# Patient Record
Sex: Female | Born: 1990 | Race: White | Hispanic: No | Marital: Single | Smoking: Current every day smoker
Health system: Southern US, Community
[De-identification: ages and names within clinical notes are randomized; demographics above are authoritative.]

## PROBLEM LIST (undated history)

## (undated) DIAGNOSIS — F32A Depression, unspecified: Secondary | ICD-10-CM

## (undated) DIAGNOSIS — F4310 Post-traumatic stress disorder, unspecified: Secondary | ICD-10-CM

## (undated) DIAGNOSIS — F329 Major depressive disorder, single episode, unspecified: Secondary | ICD-10-CM

## (undated) DIAGNOSIS — A630 Anogenital (venereal) warts: Secondary | ICD-10-CM

## (undated) DIAGNOSIS — F319 Bipolar disorder, unspecified: Secondary | ICD-10-CM

## (undated) HISTORY — PX: HERNIA REPAIR: SHX51

## (undated) HISTORY — DX: Anogenital (venereal) warts: A63.0

---
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
## 2010-10-31 ENCOUNTER — Emergency Department (HOSPITAL_COMMUNITY)
Admission: EM | Admit: 2010-10-31 | Discharge: 2010-10-31 | Payer: Self-pay | Source: Home / Self Care | Admitting: Emergency Medicine

## 2010-11-02 LAB — POCT PREGNANCY, URINE: Preg Test, Ur: NEGATIVE

## 2010-11-03 ENCOUNTER — Emergency Department (HOSPITAL_COMMUNITY)
Admission: EM | Admit: 2010-11-03 | Discharge: 2010-11-03 | Payer: Self-pay | Source: Home / Self Care | Admitting: Emergency Medicine

## 2010-11-04 LAB — CBC
HCT: 38.7 % (ref 36.0–46.0)
Hemoglobin: 14 g/dL (ref 12.0–15.0)
MCH: 31.7 pg (ref 26.0–34.0)
MCHC: 36.2 g/dL — ABNORMAL HIGH (ref 30.0–36.0)
MCV: 87.8 fL (ref 78.0–100.0)
Platelets: 250 10*3/uL (ref 150–400)
RBC: 4.41 MIL/uL (ref 3.87–5.11)
RDW: 12.2 % (ref 11.5–15.5)
WBC: 9.8 10*3/uL (ref 4.0–10.5)

## 2010-11-04 LAB — BASIC METABOLIC PANEL
BUN: 6 mg/dL (ref 6–23)
CO2: 28 mEq/L (ref 19–32)
Calcium: 9.7 mg/dL (ref 8.4–10.5)
Chloride: 105 mEq/L (ref 96–112)
Creatinine, Ser: 0.66 mg/dL (ref 0.4–1.2)
GFR calc Af Amer: >60 mL/min (ref >60)
GFR calc non Af Amer: >60 mL/min (ref >60)
Glucose, Bld: 92 mg/dL (ref 70–99)
Potassium: 4.5 mEq/L (ref 3.5–5.1)
Sodium: 140 mEq/L (ref 135–145)

## 2010-11-04 LAB — DIFFERENTIAL
Basophils Absolute: 0 10*3/uL (ref 0.0–0.1)
Basophils Relative: 0 % (ref 0–1)
Eosinophils Absolute: 0.1 10*3/uL (ref 0.0–0.7)
Eosinophils Relative: 1 % (ref 0–5)
Lymphocytes Relative: 34 % (ref 12–46)
Lymphs Abs: 3.4 10*3/uL (ref 0.7–4.0)
Monocytes Absolute: 0.8 10*3/uL (ref 0.1–1.0)
Monocytes Relative: 8 % (ref 3–12)
Neutro Abs: 5.5 10*3/uL (ref 1.7–7.7)
Neutrophils Relative %: 56 % (ref 43–77)

## 2010-11-04 LAB — ETHANOL: Alcohol, Ethyl (B): <5 mg/dL (ref 0–10)

## 2010-11-04 LAB — URINALYSIS, ROUTINE W REFLEX MICROSCOPIC
Bilirubin Urine: NEGATIVE
Ketones, ur: NEGATIVE mg/dL
Leukocytes, UA: NEGATIVE
Nitrite: NEGATIVE
Protein, ur: NEGATIVE mg/dL
Specific Gravity, Urine: 1.015 (ref 1.005–1.030)
Urine Glucose, Fasting: NEGATIVE mg/dL
Urobilinogen, UA: 0.2 mg/dL (ref 0.0–1.0)
pH: 6 (ref 5.0–8.0)

## 2010-11-04 LAB — RAPID URINE DRUG SCREEN, HOSP PERFORMED
Amphetamines: NOT DETECTED
Barbiturates: NOT DETECTED
Benzodiazepines: NOT DETECTED
Cocaine: NOT DETECTED
Opiates: NOT DETECTED
Tetrahydrocannabinol: POSITIVE — AB

## 2010-11-04 LAB — URINE MICROSCOPIC-ADD ON

## 2010-11-04 LAB — PREGNANCY, URINE: Preg Test, Ur: NEGATIVE

## 2010-11-25 ENCOUNTER — Emergency Department (HOSPITAL_COMMUNITY)
Admission: EM | Admit: 2010-11-25 | Discharge: 2010-11-27 | Disposition: A | Discharge status: Other Acute Inpatient Hospital | Payer: 59 | Attending: Emergency Medicine | Admitting: Emergency Medicine

## 2010-11-25 DIAGNOSIS — F29 Unspecified psychosis not due to a substance or known physiological condition: Secondary | ICD-10-CM | POA: Insufficient documentation

## 2010-11-25 DIAGNOSIS — IMO0002 Reserved for concepts with insufficient information to code with codable children: Secondary | ICD-10-CM | POA: Insufficient documentation

## 2010-11-25 DIAGNOSIS — F411 Generalized anxiety disorder: Secondary | ICD-10-CM | POA: Insufficient documentation

## 2010-11-25 DIAGNOSIS — I498 Other specified cardiac arrhythmias: Secondary | ICD-10-CM | POA: Insufficient documentation

## 2010-11-25 LAB — URINALYSIS, ROUTINE W REFLEX MICROSCOPIC
Bilirubin Urine: NEGATIVE
Hgb urine dipstick: NEGATIVE
Ketones, ur: NEGATIVE mg/dL
Nitrite: NEGATIVE
Protein, ur: NEGATIVE mg/dL
Specific Gravity, Urine: 1.011 (ref 1.005–1.030)
Urine Glucose, Fasting: NEGATIVE mg/dL
Urobilinogen, UA: 0.2 mg/dL (ref 0.0–1.0)
pH: 7 (ref 5.0–8.0)

## 2010-11-25 LAB — ACETAMINOPHEN LEVEL: Acetaminophen (Tylenol), Serum: <10 ug/mL — ABNORMAL LOW (ref 10–30)

## 2010-11-25 LAB — RAPID URINE DRUG SCREEN, HOSP PERFORMED
Amphetamines: NOT DETECTED
Barbiturates: NOT DETECTED
Benzodiazepines: NOT DETECTED
Cocaine: NOT DETECTED
Opiates: NOT DETECTED
Tetrahydrocannabinol: NOT DETECTED

## 2010-11-25 LAB — POCT I-STAT, CHEM 8
BUN: 9 mg/dL (ref 6–23)
Calcium, Ion: 1.12 mmol/L (ref 1.12–1.32)
Chloride: 105 mEq/L (ref 96–112)
Creatinine, Ser: 0.8 mg/dL (ref 0.4–1.2)
Glucose, Bld: 104 mg/dL — ABNORMAL HIGH (ref 70–99)
HCT: 44 % (ref 36.0–46.0)
Hemoglobin: 15 g/dL (ref 12.0–15.0)
Potassium: 3.5 mEq/L (ref 3.5–5.1)
Sodium: 140 mEq/L (ref 135–145)
TCO2: 22 mmol/L (ref 0–100)

## 2010-11-25 LAB — AMMONIA: Ammonia: 15 umol/L (ref 11–35)

## 2010-11-25 LAB — TRICYCLICS SCREEN, URINE: TCA Scrn: NOT DETECTED

## 2010-11-25 LAB — CK TOTAL AND CKMB (NOT AT ARMC)
CK, MB: 1 ng/mL (ref 0.3–4.0)
Relative Index: 0 (ref 0.0–2.5)
Total CK: 79 U/L (ref 7–177)

## 2010-11-25 LAB — ETHANOL: Alcohol, Ethyl (B): <5 mg/dL (ref 0–10)

## 2010-11-25 LAB — SALICYLATE LEVEL: Salicylate Lvl: <4 mg/dL (ref 2.8–20.0)

## 2010-11-26 DIAGNOSIS — F29 Unspecified psychosis not due to a substance or known physiological condition: Secondary | ICD-10-CM

## 2010-11-27 ENCOUNTER — Inpatient Hospital Stay (HOSPITAL_COMMUNITY)
Admission: AD | Admit: 2010-11-27 | Discharge: 2010-12-01 | DRG: 885 | Disposition: A | Discharge status: Home / Self Care | Payer: 59 | Attending: Psychiatry | Admitting: Psychiatry

## 2010-11-27 DIAGNOSIS — F3289 Other specified depressive episodes: Secondary | ICD-10-CM

## 2010-11-27 DIAGNOSIS — B977 Papillomavirus as the cause of diseases classified elsewhere: Secondary | ICD-10-CM

## 2010-11-27 DIAGNOSIS — F29 Unspecified psychosis not due to a substance or known physiological condition: Principal | ICD-10-CM

## 2010-11-27 DIAGNOSIS — H5316 Psychophysical visual disturbances: Secondary | ICD-10-CM

## 2010-11-27 DIAGNOSIS — R45851 Suicidal ideations: Secondary | ICD-10-CM

## 2010-11-27 DIAGNOSIS — F329 Major depressive disorder, single episode, unspecified: Secondary | ICD-10-CM

## 2010-11-28 DIAGNOSIS — F29 Unspecified psychosis not due to a substance or known physiological condition: Secondary | ICD-10-CM
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
## 2011-01-28 ENCOUNTER — Ambulatory Visit (INDEPENDENT_AMBULATORY_CARE_PROVIDER_SITE_OTHER): Payer: 59 | Admitting: Psychiatry

## 2011-01-28 DIAGNOSIS — F29 Unspecified psychosis not due to a substance or known physiological condition: Secondary | ICD-10-CM
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
## 2011-02-11 ENCOUNTER — Encounter (INDEPENDENT_AMBULATORY_CARE_PROVIDER_SITE_OTHER): Payer: 59 | Admitting: Psychiatry

## 2011-02-11 DIAGNOSIS — F3189 Other bipolar disorder: Secondary | ICD-10-CM

## 2011-02-18 ENCOUNTER — Ambulatory Visit (HOSPITAL_COMMUNITY): Payer: 59 | Admitting: Psychiatry

## 2011-02-25 ENCOUNTER — Encounter (INDEPENDENT_AMBULATORY_CARE_PROVIDER_SITE_OTHER): Payer: 59 | Admitting: Psychiatry

## 2011-02-25 DIAGNOSIS — F3189 Other bipolar disorder: Secondary | ICD-10-CM

## 2011-03-04 ENCOUNTER — Encounter (INDEPENDENT_AMBULATORY_CARE_PROVIDER_SITE_OTHER): Payer: 59 | Admitting: Psychiatry

## 2011-03-04 DIAGNOSIS — F3189 Other bipolar disorder: Secondary | ICD-10-CM

## 2011-03-05 NOTE — H&P (Signed)
[facility]  Patient:    Carol Miles, Carol Miles Visit Number: [id number] MRN: [id number]          Service Type: DSU Location: DAY Attending Physician:  Dalia Heading Dictated by:   Franky Macho, M.D. Admit Date:  12/22/2001   CC:         Era Bumpers, M.D., [place], [place]   History and Physical  DATE OF BIRTH:  03/17/1991  CHIEF COMPLAINT:  Right inguinal hernia.  HISTORY OF PRESENT ILLNESS:  Patient is an 20 year old white female who is referred for evaluation and treatment of a right inguinal hernia.  It was first noted last week.  It causes pain with straining.  No nausea or vomiting have been noted.  PAST MEDICAL HISTORY:  Unremarkable.  PAST SURGICAL HISTORY:  Unremarkable.  CURRENT MEDICATIONS:  None.  ALLERGIES:  PENICILLIN, SULFA, ERYTHROMYCIN.  REVIEW OF SYSTEMS:  Unremarkable.  PHYSICAL EXAMINATION:  GENERAL:  Patient is a well-developed, well-nourished white female in no acute distress.  VITAL SIGNS:  She is afebrile and vital signs are stable.  LUNGS:  Clear to auscultation with equal breath sounds bilaterally.  HEART:  Regular rate and rhythm without S3, S4, or murmurs.  ABDOMEN:  Soft, nontender, and nondistended.  A small reducible right inguinal hernia is noted.  No left inguinal hernia is noted.  No hepatosplenomegaly or masses are noted.  IMPRESSION:  Right inguinal hernia.  PLAN:  The patient is scheduled for right inguinal herniorrhaphy on December 22, 2001.  The risks and benefits of the procedure including bleeding, infection, and the possibility of recurrence were fully explained to the patients mother, who gave informed consent. Dictated by:   Franky Macho, M.D. Attending Physician:  Dalia Heading DD:  12/21/01 TD:  12/22/01 Job: [id number]

## 2011-03-05 NOTE — Op Note (Signed)
[facility]  Patient:    Carol Miles, Carol Miles Visit Number: [id number] MRN: [id number]          Service Type: DSU Location: DAY Attending Physician:  Dalia Heading Dictated by:   Franky Macho, M.D. Proc. Date: 12/22/01 Admit Date:  12/22/2001   CC:         Dr. Dimas Aguas, [facility]   Operative Report  PATIENT AGE:  20 years old  PREOPERATIVE DIAGNOSIS:  Right inguinal hernia.  POSTOPERATIVE DIAGNOSIS:  Right inguinal hernia.  OPERATION:  Right inguinal herniorrhaphy.  SURGEON:  Franky Macho, M.D.  ANESTHESIA:  General.  INDICATIONS:  The patient is an 20 year old white female who presents with a right inguinal hernia.  The risks and benefits of the procedure including bleeding, infection, and recurrence of the hernia were fully explained to the patients parents who gave informed consent.  DESCRIPTION OF PROCEDURE:  The patient was placed in the supine position. After general anesthesia was administered, the right groin region was prepped and draped using the usual sterile technique with Betadine.  A transverse incision was made in the right groin region down to the external oblique aponeurosis.  The aponeurosis was incised to the external ring.  An indirect hernia sac was identified.  This was freed away from the processes vaginalis.  A high ligation of the hernia sac was performed, and the remnant hernia sac was inverted after being suture ligated with a 4-0 Tycron tie.  The processes vaginalis was also excised without difficulty.  The shelving edge of Pouparts ligament was then brought up to the conjoin tendon using 4-0 Tycron interrupted sutures.  Care was taken to avoid the ilioinguinal nerve.  The external oblique aponeurosis was reapproximated using a 3-0 Vicryl running suture.  The subcutaneous tissue was brought back together using 4-0 Vicryl interrupted sutures.  The skin was closed using a 4-0 Vicryl subcuticular suture.  Marcaine 0.25%  was instilled into the surrounding wound, and the wound was covered with collodion.  All tape and needle counts were correct at the end of the procedure.  The patient was awakened and transferred to PACU in stable condition.  COMPLICATIONS:  None.  SPECIMEN:   Right inguinal hernia sac.  ESTIMATED BLOOD LOSS:  Minimal. Dictated by:   Franky Macho, M.D. Attending Physician:  Dalia Heading DD:  12/22/01 TD:  12/23/01 Job: [id number]

## 2011-03-18 ENCOUNTER — Encounter (INDEPENDENT_AMBULATORY_CARE_PROVIDER_SITE_OTHER): Payer: 59 | Admitting: Psychiatry

## 2011-03-18 DIAGNOSIS — F3189 Other bipolar disorder: Secondary | ICD-10-CM

## 2011-04-15 ENCOUNTER — Encounter (HOSPITAL_COMMUNITY): Payer: 59 | Admitting: Psychiatry

## 2011-04-15 ENCOUNTER — Encounter (INDEPENDENT_AMBULATORY_CARE_PROVIDER_SITE_OTHER): Payer: 59 | Admitting: Psychiatry

## 2011-04-15 DIAGNOSIS — F3189 Other bipolar disorder: Secondary | ICD-10-CM

## 2011-05-13 ENCOUNTER — Encounter (INDEPENDENT_AMBULATORY_CARE_PROVIDER_SITE_OTHER): Payer: 59 | Admitting: Psychiatry

## 2011-05-13 DIAGNOSIS — F3189 Other bipolar disorder: Secondary | ICD-10-CM

## 2011-07-20 ENCOUNTER — Encounter (HOSPITAL_COMMUNITY): Payer: 59 | Admitting: Psychiatry

## 2011-07-27 ENCOUNTER — Encounter (HOSPITAL_COMMUNITY): Payer: 59 | Admitting: Psychiatry

## 2011-09-21 ENCOUNTER — Encounter (HOSPITAL_COMMUNITY): Payer: Self-pay | Admitting: Psychiatry

## 2011-09-21 ENCOUNTER — Ambulatory Visit (INDEPENDENT_AMBULATORY_CARE_PROVIDER_SITE_OTHER): Payer: 59 | Admitting: Psychiatry

## 2011-09-21 VITALS — BP 100/70

## 2011-09-21 DIAGNOSIS — F3189 Other bipolar disorder: Secondary | ICD-10-CM

## 2011-09-21 MED ORDER — ARIPIPRAZOLE 5 MG PO TABS: 5.0000 mg | ORAL_TABLET | Freq: Every day | ORAL | Status: DC

## 2011-09-21 MED ORDER — FLUOXETINE HCL 20 MG PO CAPS: 20.0000 mg | ORAL_CAPSULE | Freq: Every day | ORAL | Status: DC
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
## 2011-09-28 ENCOUNTER — Ambulatory Visit (INDEPENDENT_AMBULATORY_CARE_PROVIDER_SITE_OTHER): Payer: 59 | Admitting: Psychiatry

## 2011-09-28 DIAGNOSIS — F3189 Other bipolar disorder: Secondary | ICD-10-CM

## 2011-09-28 MED ORDER — ARIPIPRAZOLE 10 MG PO TABS: 5.0000 mg | ORAL_TABLET | Freq: Every day | ORAL | Status: DC

## 2011-09-28 NOTE — Progress Notes (Signed)
  [facility] [place] [facility] [address] Progress Note  Carol Miles [id number] 20 y.o.  09/28/2011 12:07 PM  Patient came for her followup appointment. She has started her Abilify 5 mg along with the Prozac. She is reporting feeling better with the current medication however she continued to have episodic agitation anger and racing thoughts. She still feel tired decreased motivation and has not started to look for work. Patient reported no side effects of medication. She has been on these medication in the past however decided to stop few months ago. She realized she need to go back on these medication and even willing to increase her Abilify to 10 mg. She denies any side effects of medication. She continues to have issues with her brother but she has a very good relationship with her father. She continued to smoke marijuana daily and had struggle to stopping it. However patient is determined to stop one day and has been seeing therapist on weekly basis. She denies any agitation anger or violent episode.  Current medication    Outpatient Encounter Prescriptions as of 09/28/2011  Medication Sig Dispense Refill  . ARIPiprazole (ABILIFY) 5 MG tablet Take 1 tablet (5 mg total) by mouth daily.  30 tablet  0  . FLUoxetine (PROZAC) 20 MG capsule Take 1 capsule (20 mg total) by mouth daily.  30 capsule  1  . JUNEL 1/20 1-20 MG-MCG tablet       . nystatin-triamcinolone ointment (MYCOLOG)         Mental status examination Patient is casually dressed and fairly groomed he at she appears tired but her speech is soft clear and coherent. She denies any active or passive suicidal thoughts or homicidal thoughts. Her thought process is slow but logical linear and goal-directed. Her attention and concentration is fair. She still have residual paranoia but there were no delusions or obsessions present. She denies any auditory or visual hallucination. She's alert and oriented x3. Her insight judgment and impulse  control is fair  Assessment: Axis I: Bipolar disorder with psychotic features             cannabis abuse  Axis II: Deferred  Axis III: None  Axis IV: Moderate  Axis V: 45-55   Plan:  Overall patient has been improved but she still have residual depression of anxiety depression and paranoid thinking. I will increase her Abilify to 10 mg but she was taking in the past with good response. She will continue Prozac 20 mg daily. She does not reported any side effects. She recalled having no side effects with these medication. She is very motivated for therapy and continue treatment. Again We have a long talk about stopping the marijuana. I have explained that continue use of these substances may delay her recovery and prolong her symptoms. We also talked about interaction of medication with illegal substances in detail. I reinforce about safety plan that if she continued to feel very depressed and anytime having suicidal thoughts and homicidal thoughts then she need to call us immediately or go to local ER which he agreed. I have explained in detail about the risks and benefits of medication. I will see her again in 4 weeks week. She will continue her therapy on weekly basis with Edwyna Shell.  A new prescription for Abilify 10 mg is given for 90 day supply.  Sowmya Partridge T., MD 09/28/2011

## 2011-11-11 ENCOUNTER — Ambulatory Visit (INDEPENDENT_AMBULATORY_CARE_PROVIDER_SITE_OTHER): Payer: 59 | Admitting: Psychiatry

## 2011-11-11 ENCOUNTER — Encounter (HOSPITAL_COMMUNITY): Payer: Self-pay | Admitting: Psychiatry

## 2011-11-11 VITALS — Wt 160.0 lb

## 2011-11-11 DIAGNOSIS — F3189 Other bipolar disorder: Secondary | ICD-10-CM

## 2011-11-11 MED ORDER — ARIPIPRAZOLE 10 MG PO TABS: 10.0000 mg | ORAL_TABLET | Freq: Every day | ORAL | Status: DC

## 2011-11-11 MED ORDER — FLUOXETINE HCL 20 MG PO CAPS: 20.0000 mg | ORAL_CAPSULE | Freq: Every day | ORAL | Status: DC

## 2011-11-11 NOTE — Progress Notes (Signed)
[facility] [address] Progress Note  Carol Miles [id number] 21 y.o.  11/11/2011 2:46 PM  History of presenting illness  Patient came for her followup appointment. She is now taking Abilify 10 mg. She reported no side effects of medication however she is concerned about weight gain. She gain 10 lbs since started Abilify.  She is afraid to stop Abilify as Abilify did help her paranoid thinking and her mood. She is less depressed and less irritable. She sleeps good. She denies any agitation anger or mood swings. She continues to have issues with her boyfriend but she is tolerating very well. She still complain of breast lactation at times however she has not seen her primary care physician or any prolactin level has been done.  Psychosocial history Patient lives with her father. She has 4 other siblings. She is not close with her mother who lives in [place]. She had a good relationship with her father. She had a very quiet Christmas. Her brother came to visit her. Patient is still looking for a job and she has interview at [employer] and she is hopeful to get this job.  Alcohol and substance use history She continues to smoke marijuana on occasion mostly with her friends. However her intensity and frequency has been reduced from the past. She is seeing her therapist every 2 weeks.   Medical history Unchanged  Current medication   Outpatient Encounter Prescriptions as of 11/11/2011  Medication Sig Dispense Refill  . ARIPiprazole (ABILIFY) 10 MG tablet Take 1 tablet (10 mg total) by mouth daily.  90 tablet  0  . CHERATUSSIN AC 100-10 MG/5ML syrup       . FLUoxetine (PROZAC) 20 MG capsule Take 1 capsule (20 mg total) by mouth daily.  90 capsule  0  . DISCONTD: ARIPiprazole (ABILIFY) 10 MG tablet Take 0.5 tablets (5 mg total) by mouth daily.  90 tablet  0  . DISCONTD: FLUoxetine (PROZAC) 20 MG capsule Take 1 capsule (20 mg total) by mouth daily.  30 capsule  1  . JUNEL 1/20 1-20  MG-MCG tablet       . nystatin-triamcinolone ointment (MYCOLOG)        Past psychiatric history Patient has at least one previous psychiatric hospitalization in February 2012. She has suicidal attempt when she [place]. In the past she had tried Risperdal which was a stop due to breast lactation.   Mental status examination Patient is casually dressed and fairly groomed. She is more cooperative and pleasant from the past. Her speech is clear and coherent. She denies any active or passive suicidal thoughts or homicidal thoughts. Her thought process is slow but logical linear and goal-directed. Her attention and concentration is improved from the past She still have some some residual paranoia but there were no delusions or obsessions present. She denies any auditory or visual hallucination. She's alert and oriented x3. Her insight judgment and impulse control is fair  Assessment: Axis I: Bipolar disorder with psychotic features             cannabis abuse  Axis II: Deferred  Axis III: None  Axis IV: Moderate  Axis V: 45-55   Plan:  Overall patient has been improved since we increased Abilify to 10 mg. Patient reported no side effects including tremors or extrapyramidal side effects. I talk about exercise and healthy diet to control her weight. She admitted to she's been not watching her diet carefully. She does not want to stop  Abilify which did help her a lot.  She will continue Prozac 20 mg daily. She does not reported any side effects. She is very motivated for therapy and continue treatment. Again We have a long talk about stopping the marijuana. I have explained that continue use of these substances may delay her recovery and prolong her symptoms. We also talked about interaction of medication with illegal substances in detail. I reinforce about safety plan that if she continued to feel very depressed and anytime having suicidal thoughts and homicidal thoughts then she need  to call us immediately or go to local ER which he agreed. I have explained in detail about the risks and benefits of medication. I will see her again in 8 weeks week. She will continue her therapy on weekly basis with Carol Miles.  A new prescription for Abilify 10 mg and Prozac 20 mg is given for 90 day supply. I also recommended to have blood work including prolactin level at primary care physician. Time spent 30 minutes. I will see her again in 2 months Carol Popson T., MD 11/11/2011

## 2011-11-14 ENCOUNTER — Other Ambulatory Visit (HOSPITAL_COMMUNITY): Payer: Self-pay | Admitting: Psychiatry

## 2011-11-15 ENCOUNTER — Other Ambulatory Visit (HOSPITAL_COMMUNITY): Payer: Self-pay | Admitting: Psychiatry

## 2011-11-15 NOTE — Telephone Encounter (Signed)
New script given last week with change dose

## 2012-01-06 ENCOUNTER — Ambulatory Visit (HOSPITAL_COMMUNITY): Payer: 59 | Admitting: Psychiatry

## 2012-01-17 ENCOUNTER — Telehealth (HOSPITAL_COMMUNITY): Payer: Self-pay | Admitting: *Deleted

## 2012-01-18 ENCOUNTER — Ambulatory Visit (INDEPENDENT_AMBULATORY_CARE_PROVIDER_SITE_OTHER): Payer: 59 | Admitting: Psychiatry

## 2012-01-18 ENCOUNTER — Encounter (HOSPITAL_COMMUNITY): Payer: Self-pay | Admitting: Psychiatry

## 2012-01-18 DIAGNOSIS — F3189 Other bipolar disorder: Secondary | ICD-10-CM

## 2012-01-18 MED ORDER — FLUOXETINE HCL 20 MG PO CAPS: 20.0000 mg | ORAL_CAPSULE | Freq: Every day | ORAL | Status: DC

## 2012-01-18 NOTE — Progress Notes (Signed)
[facility] [address] Progress Note  Carol Miles [id number] 21 y.o.  01/18/2012 11:30 AM  Chief complaint Medication management and followup.  History of presenting illness  Patient is 21 years old female who came for her followup appointment. She is compliant with Abilify 10 mg and Prozac 20 mg daily.  She's doing much better but the medication however she admits some time she forgets to take her medication on time.  She has recently stopped working at [employer] and liked her work.  She denies using drugs or alcohol.  She recently broke up with her boyfriend due to his continued use of drugs.  Patient is very happy that she got job at [employer] and she is drug free.  She passed her drug test and feel proud about it.  She is sleeping fine however she continues to have dreams on occasion.  She denies any agitation anger or mood swings.  She denies any crying spells or any social isolation.  She likes Abilify which really makes her capable to keep a job.  She is less paranoid and less agitated.  She denies any tremors or any side effects however she has gained weight from the past.  She is seriously thinking about exercise and hoping to start walking regularly .  She's also watching her diet carefully .  She denies any concerns for the medication to like to continue her Abilify and Prozac.    Current psychiatric medication Prozac 40 mg daily Abilify 10 mg daily   Psychosocial history Patient lives with her father. She has 4 other siblings. She is not close with her mother who lives in [place]. She had a good relationship with her father.   Alcohol and substance use history She denies any current use of alcohol or substance use.  Medical history Unchanged  Current medication  Outpatient Encounter Prescriptions as of 01/18/2012  Medication Sig Dispense Refill  . ARIPiprazole (ABILIFY) 10 MG tablet Take 1 tablet (10 mg total) by mouth daily.  90 tablet  0  . FLUoxetine (PROZAC)  20 MG capsule Take 1 capsule (20 mg total) by mouth daily.  90 capsule  0  . CHERATUSSIN AC 100-10 MG/5ML syrup       . imiquimod (ALDARA) 5 % cream       . JUNEL 1/20 1-20 MG-MCG tablet       . nystatin-triamcinolone ointment (MYCOLOG)        Past psychiatric history Patient has at least one previous psychiatric hospitalization in February 2012. She has suicidal attempt when she [place] her [place]. In the past she had tried Risperdal which was a stop due to breast lactation.   Mental status examination Patient is casually dressed and fairly groomed. She is cooperative and pleasant. Her speech is clear and coherent. She denies any active or passive suicidal thoughts or homicidal thoughts. Her thought process is slow but logical linear and goal-directed. Her attention and concentration is improved from the past She denies any paranoia or delusions .  She described her mood is good and her affect is improved from the past.  She denies any auditory or visual hallucination. She's alert and oriented x3. Her insight judgment and impulse control is fair  Assessment: Axis I: Bipolar disorder with psychotic features             cannabis abuse Axis II: Deferred Axis III: None Axis IV: Moderate Axis V: 45-55   Plan:  I will continue her current medication which  is Abilify 10 mg daily and Prozac 20 mg daily.  I have explained risks and benefits of medication in detail.  She will continue to see therapist for increase coping and social skills.  I encourage her to watch her diet and do regular exercise to prevent any weight gain .  We will consider blood work on her next visit.  I recommended to call us if she feels worsening of symptoms or any time having suicidal thinking and homicidal thinking.  I will see her again in 8 weeks.   Letcher Schweikert T., MD 01/18/2012

## 2012-01-25 ENCOUNTER — Encounter (HOSPITAL_COMMUNITY): Payer: Self-pay | Admitting: Psychiatry

## 2012-01-25 ENCOUNTER — Ambulatory Visit (INDEPENDENT_AMBULATORY_CARE_PROVIDER_SITE_OTHER): Payer: 59 | Admitting: Psychiatry

## 2012-01-25 VITALS — BP 112/64 | HR 85 | Wt 167.0 lb

## 2012-01-25 DIAGNOSIS — F3189 Other bipolar disorder: Secondary | ICD-10-CM

## 2012-01-25 MED ORDER — ARIPIPRAZOLE 10 MG PO TABS: 2.0000 mg | ORAL_TABLET | Freq: Every day | ORAL | Status: DC

## 2012-01-25 MED ORDER — ARIPIPRAZOLE 10 MG PO TABS: 10.0000 mg | ORAL_TABLET | Freq: Every day | ORAL | Status: DC

## 2012-01-25 MED ORDER — FLUOXETINE HCL 10 MG PO CAPS: ORAL_CAPSULE | ORAL | Status: DC
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
## 2012-02-03 ENCOUNTER — Ambulatory Visit (INDEPENDENT_AMBULATORY_CARE_PROVIDER_SITE_OTHER): Payer: 59 | Admitting: Psychiatry

## 2012-02-03 ENCOUNTER — Encounter (HOSPITAL_COMMUNITY): Payer: Self-pay | Admitting: Psychiatry

## 2012-02-03 DIAGNOSIS — F319 Bipolar disorder, unspecified: Secondary | ICD-10-CM
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
## 2012-02-24 ENCOUNTER — Ambulatory Visit (HOSPITAL_COMMUNITY): Payer: Self-pay | Admitting: Psychiatry

## 2012-02-28 ENCOUNTER — Encounter (HOSPITAL_COMMUNITY): Payer: Self-pay | Admitting: *Deleted

## 2012-03-21 ENCOUNTER — Ambulatory Visit (HOSPITAL_COMMUNITY): Payer: Self-pay | Admitting: Psychiatry

## 2012-03-29 ENCOUNTER — Other Ambulatory Visit (HOSPITAL_COMMUNITY): Payer: Self-pay | Admitting: *Deleted

## 2012-03-29 ENCOUNTER — Other Ambulatory Visit (HOSPITAL_COMMUNITY): Payer: Self-pay | Admitting: Psychiatry

## 2012-03-29 DIAGNOSIS — F3189 Other bipolar disorder: Secondary | ICD-10-CM

## 2012-03-29 MED ORDER — ARIPIPRAZOLE 5 MG PO TABS: ORAL_TABLET | ORAL | Status: DC

## 2012-03-29 MED ORDER — FLUOXETINE HCL 10 MG PO CAPS: ORAL_CAPSULE | ORAL | Status: DC

## 2012-03-30 ENCOUNTER — Other Ambulatory Visit (HOSPITAL_COMMUNITY): Payer: Self-pay

## 2012-04-04 ENCOUNTER — Encounter (HOSPITAL_COMMUNITY): Payer: Self-pay | Admitting: *Deleted

## 2012-04-04 ENCOUNTER — Other Ambulatory Visit (HOSPITAL_COMMUNITY): Payer: Self-pay | Admitting: *Deleted

## 2012-04-04 NOTE — Progress Notes (Signed)
Authorization for Abilify (quantity override) obtained from [employer] from 03/05/12 to 05/04/12. Auth # [id number]

## 2013-12-31 ENCOUNTER — Encounter (HOSPITAL_COMMUNITY): Payer: Self-pay | Admitting: Emergency Medicine

## 2013-12-31 ENCOUNTER — Emergency Department (HOSPITAL_COMMUNITY)
Admission: EM | Admit: 2013-12-31 | Discharge: 2013-12-31 | Disposition: A | Discharge status: Home / Self Care | Payer: 59 | Attending: Emergency Medicine | Admitting: Emergency Medicine

## 2013-12-31 ENCOUNTER — Emergency Department (HOSPITAL_COMMUNITY): Payer: 59

## 2013-12-31 DIAGNOSIS — Z880 Allergy status to penicillin: Secondary | ICD-10-CM | POA: Insufficient documentation

## 2013-12-31 DIAGNOSIS — Z79899 Other long term (current) drug therapy: Secondary | ICD-10-CM | POA: Insufficient documentation

## 2013-12-31 DIAGNOSIS — Z8619 Personal history of other infectious and parasitic diseases: Secondary | ICD-10-CM | POA: Insufficient documentation

## 2013-12-31 DIAGNOSIS — F17200 Nicotine dependence, unspecified, uncomplicated: Secondary | ICD-10-CM | POA: Insufficient documentation

## 2013-12-31 DIAGNOSIS — T07XXXA Unspecified multiple injuries, initial encounter: Secondary | ICD-10-CM

## 2013-12-31 DIAGNOSIS — S46909A Unspecified injury of unspecified muscle, fascia and tendon at shoulder and upper arm level, unspecified arm, initial encounter: Secondary | ICD-10-CM | POA: Insufficient documentation

## 2013-12-31 DIAGNOSIS — Y92410 Unspecified street and highway as the place of occurrence of the external cause: Secondary | ICD-10-CM | POA: Insufficient documentation

## 2013-12-31 DIAGNOSIS — Y9389 Activity, other specified: Secondary | ICD-10-CM | POA: Insufficient documentation

## 2013-12-31 DIAGNOSIS — S298XXA Other specified injuries of thorax, initial encounter: Secondary | ICD-10-CM | POA: Insufficient documentation

## 2013-12-31 DIAGNOSIS — S4980XA Other specified injuries of shoulder and upper arm, unspecified arm, initial encounter: Secondary | ICD-10-CM | POA: Insufficient documentation

## 2013-12-31 MED ORDER — IBUPROFEN 800 MG PO TABS
800.0000 mg | ORAL_TABLET | Freq: Once | ORAL | Status: AC
  Administered 2013-12-31: 800 mg via ORAL
  Filled 2013-12-31: qty 1

## 2013-12-31 NOTE — ED Notes (Signed)
Pt resting quietly, respirations regular, even and unlabored.  No distress noted.
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
## 2013-12-31 NOTE — ED Notes (Signed)
Pt given breakfast tray to eat in room and instructed to wait in lobby. Pt informed she will be able to use phone to find d/c transportation.

## 2013-12-31 NOTE — ED Notes (Signed)
Patient left ED at this time. No distress.

## 2013-12-31 NOTE — ED Notes (Signed)
Patient requesting pain medication prior to discharge. Dr Read DriversMolpus gave verbal order for ibuprofen 800 mg PO if patient requested before discharge.

## 2014-07-28 ENCOUNTER — Encounter (HOSPITAL_COMMUNITY): Payer: Self-pay | Admitting: Emergency Medicine

## 2014-07-28 ENCOUNTER — Emergency Department (INDEPENDENT_AMBULATORY_CARE_PROVIDER_SITE_OTHER)
Admission: EM | Admit: 2014-07-28 | Discharge: 2014-07-28 | Disposition: A | Discharge status: Home / Self Care | Payer: 59 | Source: Home / Self Care | Attending: Emergency Medicine | Admitting: Emergency Medicine

## 2014-07-28 DIAGNOSIS — H6502 Acute serous otitis media, left ear: Secondary | ICD-10-CM

## 2014-07-28 DIAGNOSIS — H9202 Otalgia, left ear: Secondary | ICD-10-CM

## 2014-07-28 MED ORDER — TRAMADOL HCL 50 MG PO TABS: 50.0000 mg | ORAL_TABLET | Freq: Four times a day (QID) | ORAL | Status: DC | PRN

## 2014-07-28 MED ORDER — CEFUROXIME AXETIL 250 MG PO TABS: 250.0000 mg | ORAL_TABLET | Freq: Two times a day (BID) | ORAL | Status: DC

## 2014-07-28 MED ORDER — ANTIPYRINE-BENZOCAINE 5.4-1.4 % OT SOLN: 3.0000 [drp] | OTIC | Status: DC | PRN

## 2014-07-28 NOTE — ED Notes (Signed)
Reports just getting over a cold now having left ear pain.  Denies drainage.  No relief with otc ear drops.  Symptoms since 10/9.  Denies fever, n/v/d

## 2014-07-28 NOTE — ED Provider Notes (Signed)
Medical screening examination/treatment/procedure(s) were performed by non-physician practitioner and as supervising physician I was immediately available for consultation/collaboration.  Leslee Homeavid Rider Ermis, M.D.  Reuben Likesavid C Dian Laprade, MD 07/28/14 [id number]

## 2014-07-28 NOTE — Discharge Instructions (Signed)
Ear Drops You have been diagnosed with a condition requiring you to put drops of medicine into your outer ear. HOME CARE INSTRUCTIONS   Put drops in the affected ear as instructed. After putting the drops in, you will need to lie down with the affected ear facing up for ten minutes so the drops will remain in the ear canal and run down and fill the canal. Continue using the ear drops for as long as directed by your health care provider.  Prior to getting up, put a cotton ball gently in your ear canal. Leave enough of the cotton ball out so it can be easily removed. Do not attempt to push this down into the canal with a cotton-tipped swab or other instrument.  Do not irrigate or wash out your ears if you have had a perforated eardrum or mastoid surgery, or unless instructed to do so by your health care provider.  Keep appointments with your health care provider as instructed.  Finish all medicine, or use for the length of time prescribed by your health care provider. Continue the drops even if your problem seems to be doing well after a couple days, or continue as instructed. SEEK MEDICAL CARE IF:  You become worse or develop increasing pain.  You notice any unusual drainage from your ear (particularly if the drainage has a bad smell).  You develop hearing difficulties.  You experience a serious form of dizziness in which you feel as if the room is spinning, and you feel nauseated (vertigo).  The outside of your ear becomes red or swollen or both. This may be a sign of an allergic reaction. MAKE SURE YOU:   Understand these instructions.  Will watch your condition.  Will get help right away if you are not doing well or get worse. Document Released: 09/28/2001 Document Revised: 10/09/2013 Document Reviewed: 05/01/2013 [facility] Patient Information 2015 [place], [place]. This information is not intended to replace advice given to you by your health care provider. Make sure you discuss any  questions you have with your health care provider.  Otitis Media With Effusion Otitis media with effusion is the presence of fluid in the middle ear. This is a common problem in children, which often follows ear infections. It may be present for weeks or longer after the infection. Unlike an acute ear infection, otitis media with effusion refers only to fluid behind the ear drum and not infection. Children with repeated ear and sinus infections and allergy problems are the most likely to get otitis media with effusion. CAUSES  The most frequent cause of the fluid buildup is dysfunction of the eustachian tubes. These are the tubes that drain fluid in the ears to the back of the nose (nasopharynx). SYMPTOMS   The main symptom of this condition is hearing loss. As a result, you or your child may:  Listen to the TV at a loud volume.  Not respond to questions.  Ask "what" often when spoken to.  Mistake or confuse one sound or word for another.  There may be a sensation of fullness or pressure but usually not pain. DIAGNOSIS   Your health care provider will diagnose this condition by examining you or your child's ears.  Your health care provider may test the pressure in you or your child's ear with a tympanometer.  A hearing test may be conducted if the problem persists. TREATMENT   Treatment depends on the duration and the effects of the effusion.  Antibiotics, decongestants, nose  drops, and cortisone-type drugs (tablets or nasal spray) may not be helpful.  Children with persistent ear effusions may have delayed language or behavioral problems. Children at risk for developmental delays in hearing, learning, and speech may require referral to a specialist earlier than children not at risk.  You or your child's health care provider may suggest a referral to an ear, nose, and throat surgeon for treatment. The following may help restore normal hearing:  Drainage of fluid.  Placement of  ear tubes (tympanostomy tubes).  Removal of adenoids (adenoidectomy). HOME CARE INSTRUCTIONS   Avoid secondhand smoke.  Infants who are breastfed are less likely to have this condition.  Avoid feeding infants while they are lying flat.  Avoid known environmental allergens.  Avoid people who are sick. SEEK MEDICAL CARE IF:   Hearing is not better in 3 months.  Hearing is worse.  Ear pain.  Drainage from the ear.  Dizziness. MAKE SURE YOU:   Understand these instructions.  Will watch your condition.  Will get help right away if you are not doing well or get worse. Document Released: 11/11/2004 Document Revised: 02/18/2014 Document Reviewed: 05/01/2013 [facility] Patient Information 2015 [place], [place]. This information is not intended to replace advice given to you by your health care provider. Make sure you discuss any questions you have with your health care provider.  Otalgia The most common reason for this in children is an infection of the middle ear. Pain from the middle ear is usually caused by a build-up of fluid and pressure behind the eardrum. Pain from an earache can be sharp, dull, or burning. The pain may be temporary or constant. The middle ear is connected to the nasal passages by a short narrow tube called the Eustachian tube. The Eustachian tube allows fluid to drain out of the middle ear, and helps keep the pressure in your ear equalized. CAUSES  A cold or allergy can block the Eustachian tube with inflammation and the build-up of secretions. This is especially likely in small children, because their Eustachian tube is shorter and more horizontal. When the Eustachian tube closes, the normal flow of fluid from the middle ear is stopped. Fluid can accumulate and cause stuffiness, pain, hearing loss, and an ear infection if germs start growing in this area. SYMPTOMS  The symptoms of an ear infection may include fever, ear pain, fussiness, increased crying, and  irritability. Many children will have temporary and minor hearing loss during and right after an ear infection. Permanent hearing loss is rare, but the risk increases the more infections a child has. Other causes of ear pain include retained water in the outer ear canal from swimming and bathing. Ear pain in adults is less likely to be from an ear infection. Ear pain may be referred from other locations. Referred pain may be from the joint between your jaw and the skull. It may also come from a tooth problem or problems in the neck. Other causes of ear pain include:  A foreign body in the ear.  Outer ear infection.  Sinus infections.  Impacted ear wax.  Ear injury.  Arthritis of the jaw or TMJ problems.  Middle ear infection.  Tooth infections.  Sore throat with pain to the ears. DIAGNOSIS  Your caregiver can usually make the diagnosis by examining you. Sometimes other special studies, including x-rays and lab work may be necessary. TREATMENT   If antibiotics were prescribed, use them as directed and finish them even if you or your  child's symptoms seem to be improved.  Sometimes PE tubes are needed in children. These are little plastic tubes which are put into the eardrum during a simple surgical procedure. They allow fluid to drain easier and allow the pressure in the middle ear to equalize. This helps relieve the ear pain caused by pressure changes. HOME CARE INSTRUCTIONS   Only take over-the-counter or prescription medicines for pain, discomfort, or fever as directed by your caregiver. DO NOT GIVE CHILDREN ASPIRIN because of the association of Reye's Syndrome in children taking aspirin.  Use a cold pack applied to the outer ear for 15-20 minutes, 03-04 times per day or as needed may reduce pain. Do not apply ice directly to the skin. You may cause frost bite.  Over-the-counter ear drops used as directed may be effective. Your caregiver may sometimes prescribe ear  drops.  Resting in an upright position may help reduce pressure in the middle ear and relieve pain.  Ear pain caused by rapidly descending from high altitudes can be relieved by swallowing or chewing gum. Allowing infants to suck on a bottle during airplane travel can help.  Do not smoke in the house or near children. If you are unable to quit smoking, smoke outside.  Control allergies. SEEK IMMEDIATE MEDICAL CARE IF:   You or your child are becoming sicker.  Pain or fever relief is not obtained with medicine.  You or your child's symptoms (pain, fever, or irritability) do not improve within 24 to 48 hours or as instructed.  Severe pain suddenly stops hurting. This may indicate a ruptured eardrum.  You or your children develop new problems such as severe headaches, stiff neck, difficulty swallowing, or swelling of the face or around the ear. Document Released: 05/21/2004 Document Revised: 12/27/2011 Document Reviewed: 09/25/2008 [facility] Patient Information 2015 [place], [place]. This information is not intended to replace advice given to you by your health care provider. Make sure you discuss any questions you have with your health care provider.

## 2014-07-28 NOTE — ED Provider Notes (Signed)
CSN: [id number]     Arrival date & time 07/28/14  1359 History   First MD Initiated Contact with Patient 07/28/14 1455     Chief Complaint  Patient presents with  . Otalgia   (Consider location/radiation/quality/duration/timing/severity/associated sxs/prior Treatment) HPI Comments: Last week with URi sx's that have mostly abated. Now with PND with left otalgia, not relieved with OTC meds.   Past Medical History  Diagnosis Date  . HPV (human papilloma virus) anogenital infection    History reviewed. No pertinent past surgical history. Family History  Problem Relation Age of Onset  . OCD Brother    History  Substance Use Topics  . Smoking status: Current Every Day Smoker -- 1.00 packs/day for 6 years    Types: Cigarettes  . Smokeless tobacco: Not on file  . Alcohol Use: Yes   OB History   Grav Para Term Preterm Abortions TAB SAB Ect Mult Living                 Review of Systems  Constitutional: Negative.   HENT: Positive for ear pain and postnasal drip. Negative for sore throat.   Respiratory: Negative.   Cardiovascular: Negative.   Gastrointestinal: Negative.   Skin: Negative for rash.  Neurological: Negative.     Allergies  Amoxicillin; Penicillins; and Sulfa antibiotics  Home Medications   Prior to Admission medications   Medication Sig Start Date End Date Taking? Authorizing Provider  ARIPiprazole (ABILIFY) 5 MG tablet Take 2 and 1/2 tab daily 03/29/12  Yes Cleotis NipperSyed T Arfeen, MD  citalopram (CELEXA) 20 MG tablet Take 20 mg by mouth daily.   Yes Historical Provider, MD  antipyrine-benzocaine Lyla Son(AURALGAN) otic solution Place 3-4 drops into the left ear every 2 (two) hours as needed for ear pain. 07/28/14   Hayden Rasmussenavid Arnet Hofferber, NP  cefUROXime (CEFTIN) 250 MG tablet Take 1 tablet (250 mg total) by mouth 2 (two) times daily with a meal. 07/28/14   Hayden Rasmussenavid Neesha Langton, NP  CHERATUSSIN AC 100-10 MG/5ML syrup  11/08/11   Historical Provider, MD  FLUoxetine (PROZAC) 10 MG capsule Take 3 capsule  every day 03/29/12   Cleotis NipperSyed T Arfeen, MD  imiquimod Mathis Dad(ALDARA) 5 % cream  11/17/11   Historical Provider, MD  Colleen CanJUNEL 1/20 1-20 MG-MCG tablet  06/15/11   Historical Provider, MD  nystatin-triamcinolone ointment Arvilla Market(MYCOLOG)  06/15/11   Historical Provider, MD  traMADol (ULTRAM) 50 MG tablet Take 1 tablet (50 mg total) by mouth every 6 (six) hours as needed. 07/28/14   Hayden Rasmussenavid Jerika Wales, NP   BP 119/69  Pulse 77  Temp(Src) 98.7 F (37.1 C) (Oral)  Resp 16  SpO2 98%  LMP 06/28/2014 Physical Exam  Nursing note and vitals reviewed. Constitutional: She is oriented to person, place, and time. She appears well-developed and well-nourished. No distress.  HENT:  Right Ear: External ear normal.  Mouth/Throat: Oropharynx is clear and moist. No oropharyngeal exudate.  L TM partially obscurred by wax. Portion ofTM bulging with apparent effusion. No dranage or moisture to the EAC  Eyes: Conjunctivae and EOM are normal.  Neck: Normal range of motion. Neck supple.  Pulmonary/Chest: Effort normal. No respiratory distress.  Lymphadenopathy:    She has no cervical adenopathy.  Neurological: She is alert and oriented to person, place, and time.  Skin: Skin is warm and dry.  Psychiatric: She has a normal mood and affect.    ED Course  Procedures (including critical care time) Labs Review Labs Reviewed - No data to display  Imaging Review  No results found.   MDM   1. Acute serous otitis media of left ear, recurrence not specified   2. Otalgia of left ear    Ceftin 250 mg .   Pt wt has taken cephalosporins without allergic reactions in past Auralgan otic gtts Tramadol F.U with PCP    Hayden Rasmussenavid Sheronda Parran, NP 07/28/14 1531

## 2018-09-02 DIAGNOSIS — F310 Bipolar disorder, current episode hypomanic: Secondary | ICD-10-CM | POA: Insufficient documentation

## 2018-09-02 DIAGNOSIS — F1220 Cannabis dependence, uncomplicated: Secondary | ICD-10-CM | POA: Insufficient documentation

## 2019-02-25 DIAGNOSIS — F1520 Other stimulant dependence, uncomplicated: Secondary | ICD-10-CM | POA: Insufficient documentation

## 2019-02-27 DIAGNOSIS — F17200 Nicotine dependence, unspecified, uncomplicated: Secondary | ICD-10-CM | POA: Insufficient documentation

## 2019-02-27 DIAGNOSIS — F1320 Sedative, hypnotic or anxiolytic dependence, uncomplicated: Secondary | ICD-10-CM | POA: Insufficient documentation

## 2019-02-27 DIAGNOSIS — F1910 Other psychoactive substance abuse, uncomplicated: Secondary | ICD-10-CM | POA: Insufficient documentation

## 2019-02-27 DIAGNOSIS — F3162 Bipolar disorder, current episode mixed, moderate: Secondary | ICD-10-CM | POA: Insufficient documentation

## 2019-03-24 ENCOUNTER — Encounter (HOSPITAL_COMMUNITY): Payer: Self-pay | Admitting: Emergency Medicine

## 2019-03-24 ENCOUNTER — Emergency Department (HOSPITAL_COMMUNITY): Payer: Self-pay

## 2019-03-24 ENCOUNTER — Emergency Department (HOSPITAL_COMMUNITY)
Admission: EM | Admit: 2019-03-24 | Discharge: 2019-03-25 | Disposition: A | Discharge status: Home / Self Care | Payer: Self-pay | Attending: Emergency Medicine | Admitting: Emergency Medicine

## 2019-03-24 ENCOUNTER — Other Ambulatory Visit: Payer: Self-pay

## 2019-03-24 DIAGNOSIS — N83201 Unspecified ovarian cyst, right side: Secondary | ICD-10-CM | POA: Insufficient documentation

## 2019-03-24 DIAGNOSIS — R102 Pelvic and perineal pain: Secondary | ICD-10-CM | POA: Insufficient documentation

## 2019-03-24 DIAGNOSIS — N83209 Unspecified ovarian cyst, unspecified side: Secondary | ICD-10-CM

## 2019-03-24 DIAGNOSIS — D72829 Elevated white blood cell count, unspecified: Secondary | ICD-10-CM | POA: Insufficient documentation

## 2019-03-24 HISTORY — DX: Bipolar disorder, unspecified: F31.9

## 2019-03-24 HISTORY — DX: Depression, unspecified: F32.A

## 2019-03-24 LAB — COMPREHENSIVE METABOLIC PANEL
ALT: 12 U/L (ref 0–44)
AST: 13 U/L — ABNORMAL LOW (ref 15–41)
Albumin: 4.6 g/dL (ref 3.5–5.0)
Alkaline Phosphatase: 46 U/L (ref 38–126)
Anion gap: 11 (ref 5–15)
BUN: 13 mg/dL (ref 6–20)
CO2: 23 mmol/L (ref 22–32)
Calcium: 9.1 mg/dL (ref 8.9–10.3)
Chloride: 103 mmol/L (ref 98–111)
Creatinine, Ser: 0.69 mg/dL (ref 0.44–1.00)
GFR calc Af Amer: >60 mL/min (ref >60)
GFR calc non Af Amer: >60 mL/min (ref >60)
Glucose, Bld: 112 mg/dL — ABNORMAL HIGH (ref 70–99)
Potassium: 3.8 mmol/L (ref 3.5–5.1)
Sodium: 137 mmol/L (ref 135–145)
Total Bilirubin: 0.7 mg/dL (ref 0.3–1.2)
Total Protein: 7.6 g/dL (ref 6.5–8.1)

## 2019-03-24 LAB — CBC WITH DIFFERENTIAL/PLATELET
Abs Immature Granulocytes: 0.08 10*3/uL — ABNORMAL HIGH (ref 0.00–0.07)
Basophils Absolute: 0.1 10*3/uL (ref 0.0–0.1)
Basophils Relative: 0 %
Eosinophils Absolute: 0.1 10*3/uL (ref 0.0–0.5)
Eosinophils Relative: 1 %
HCT: 40 % (ref 36.0–46.0)
Hemoglobin: 13.6 g/dL (ref 12.0–15.0)
Immature Granulocytes: 0 %
Lymphocytes Relative: 16 %
Lymphs Abs: 3 10*3/uL (ref 0.7–4.0)
MCH: 31.7 pg (ref 26.0–34.0)
MCHC: 34 g/dL (ref 30.0–36.0)
MCV: 93.2 fL (ref 80.0–100.0)
Monocytes Absolute: 1.1 10*3/uL — ABNORMAL HIGH (ref 0.1–1.0)
Monocytes Relative: 6 %
Neutro Abs: 14.1 10*3/uL — ABNORMAL HIGH (ref 1.7–7.7)
Neutrophils Relative %: 77 %
Platelets: 308 10*3/uL (ref 150–400)
RBC: 4.29 MIL/uL (ref 3.87–5.11)
RDW: 12.1 % (ref 11.5–15.5)
WBC: 18.4 10*3/uL — ABNORMAL HIGH (ref 4.0–10.5)
nRBC: 0 % (ref 0.0–0.2)

## 2019-03-24 LAB — URINALYSIS, ROUTINE W REFLEX MICROSCOPIC
Bilirubin Urine: NEGATIVE
Glucose, UA: NEGATIVE mg/dL
Hgb urine dipstick: NEGATIVE
Ketones, ur: NEGATIVE mg/dL
Leukocytes,Ua: NEGATIVE
Nitrite: NEGATIVE
Protein, ur: NEGATIVE mg/dL
Specific Gravity, Urine: 1.012 (ref 1.005–1.030)
pH: 5 (ref 5.0–8.0)

## 2019-03-24 LAB — WET PREP, GENITAL
Clue Cells Wet Prep HPF POC: NONE SEEN
Sperm: NONE SEEN
Trich, Wet Prep: NONE SEEN
Yeast Wet Prep HPF POC: NONE SEEN

## 2019-03-24 LAB — LIPASE, BLOOD: Lipase: 39 U/L (ref 11–51)

## 2019-03-24 LAB — POC URINE PREG, ED: Preg Test, Ur: NEGATIVE

## 2019-03-24 MED ORDER — ONDANSETRON HCL 4 MG/2ML IJ SOLN
4.0000 mg | Freq: Once | INTRAMUSCULAR | Status: AC
  Administered 2019-03-24: 23:00:00 4 mg via INTRAVENOUS
  Filled 2019-03-24: qty 2

## 2019-03-24 MED ORDER — MORPHINE SULFATE (PF) 4 MG/ML IV SOLN
4.0000 mg | Freq: Once | INTRAVENOUS | Status: AC
  Administered 2019-03-24: 23:00:00 4 mg via INTRAVENOUS
  Filled 2019-03-24: qty 1

## 2019-03-24 NOTE — ED Triage Notes (Signed)
Pt C/O suprapubic abdominal pain along with vaginal pain after sex lest night. Pt states the pain hurts worse when she bears down.

## 2019-03-24 NOTE — ED Provider Notes (Addendum)
[facility] EMERGENCY DEPARTMENT Provider Note   CSN: [id number] Arrival date & time: 03/24/19  2158    History   Chief Complaint Chief Complaint  Patient presents with   Abdominal Pain    HPI Carol Miles is a 28 y.o. female with a history of HPV, depression and bipolar disorder presenting with a 24-hour history of low pelvic pain which first occurred last night during intercourse.  She describes severe stabbing pain which localizes to her suprapubic area and her right lower pelvic region.  She has had constant pressure in this region since the pain started, it was tolerable today, although had episodes of sharp stabs of pain worsened with movement and has felt bloated with bladder and rectal pressure which was not relieved with urination or bowel movements.  Pain is worsened with movement. She has had no vaginal bleeding or discharge.  She attempted intercourse again tonight with escalation of pain.  She denies rough intercourse as a possible trigger for pain.    She has had no nausea, vomiting, fevers or chills.  She doubts pregnancy.  she has found no alleviators for her symptoms.     The history is provided by the patient.    Past Medical History:  Diagnosis Date   Bipolar 1 disorder ([place])    Depression    HPV (human papilloma virus) anogenital infection     There are no active problems to display for this patient.   Past Surgical History:  Procedure Laterality Date   HERNIA REPAIR       OB History   No obstetric history on file.      Home Medications    Prior to Admission medications   Medication Sig Start Date End Date Taking? Authorizing Provider  lithium carbonate 300 MG capsule Take 300 mg by mouth 2 (two) times daily with a meal.  03/18/19  Yes [provider]    Family History Family History  Problem Relation Age of Onset   OCD Brother     Social History Social History   Tobacco Use   Smoking status: Current Every Day Smoker      Packs/day: 1.00    Years: 6.00    Pack years: 6.00    Types: Cigarettes   Smokeless tobacco: Never Used  Substance Use Topics   Alcohol use: Yes   Drug use: Yes    Comment: THC with friends     Allergies   Amoxicillin; Penicillins; and Sulfa antibiotics   Review of Systems Review of Systems  Constitutional: Negative for chills and fever.  HENT: Negative for congestion and sore throat.   Eyes: Negative.   Respiratory: Negative for chest tightness and shortness of breath.   Cardiovascular: Negative for chest pain.  Gastrointestinal: Negative for abdominal pain, nausea and vomiting.  Genitourinary: Positive for pelvic pain. Negative for difficulty urinating, dysuria, flank pain, frequency, hematuria, vaginal bleeding and vaginal discharge.  Musculoskeletal: Negative for arthralgias, joint swelling and neck pain.  Skin: Negative.  Negative for rash and wound.  Neurological: Negative for dizziness, weakness, light-headedness, numbness and headaches.  Psychiatric/Behavioral: Negative.      Physical Exam Updated Vital Signs BP 110/70    Pulse 76    Temp 98.7 F (37.1 C) (Oral)    Resp 16    Ht 5\' 7"  (1.702 m)    Wt 61.2 kg    LMP 02/21/2019 (Within Months)    SpO2 100%    BMI 21.14 kg/m   Physical Exam Vitals  signs and nursing note reviewed.  Constitutional:      Appearance: She is well-developed.  HENT:     Head: Normocephalic and atraumatic.  Eyes:     Conjunctiva/sclera: Conjunctivae normal.  Neck:     Musculoskeletal: Normal range of motion.  Cardiovascular:     Rate and Rhythm: Normal rate and regular rhythm.     Heart sounds: Normal heart sounds.  Pulmonary:     Effort: Pulmonary effort is normal.     Breath sounds: Normal breath sounds. No wheezing.  Abdominal:     General: Bowel sounds are normal.     Palpations: Abdomen is soft.     Tenderness: There is abdominal tenderness in the right lower quadrant and suprapubic area. There is guarding. There is  no rebound.  Genitourinary:    Vagina: No signs of injury and foreign body. Tenderness present. No vaginal discharge, erythema or bleeding.     Cervix: No cervical motion tenderness, discharge or friability.     Uterus: Normal. Tender.      Adnexa:        Right: Tenderness and fullness present.      Comments: Exquisite ttp right adnexa, less so midline, but with some mild uterine tenderness.  Fullness noted right adnexa. Musculoskeletal: Normal range of motion.  Skin:    General: Skin is warm and dry.  Neurological:     Mental Status: She is alert.      ED Treatments / Results  Labs (all labs ordered are listed, but only abnormal results are displayed) Labs Reviewed  WET PREP, GENITAL - Abnormal; Notable for the following components:      Result Value   WBC, Wet Prep HPF POC FEW (*)    All other components within normal limits  COMPREHENSIVE METABOLIC PANEL - Abnormal; Notable for the following components:   Glucose, Bld 112 (*)    AST 13 (*)    All other components within normal limits  CBC WITH DIFFERENTIAL/PLATELET - Abnormal; Notable for the following components:   WBC 18.4 (*)    Neutro Abs 14.1 (*)    Monocytes Absolute 1.1 (*)    Abs Immature Granulocytes 0.08 (*)    All other components within normal limits  URINALYSIS, ROUTINE W REFLEX MICROSCOPIC - Abnormal; Notable for the following components:   Color, Urine STRAW (*)    All other components within normal limits  LIPASE, BLOOD  POC URINE PREG, ED  GC/CHLAMYDIA PROBE AMP ([facility] [facility]) NOT AT [facility]    EKG None  Radiology [place] Transvaginal Non-ob  Result Date: 03/25/2019 CLINICAL DATA:  Right adnexal pain EXAM: TRANSABDOMINAL AND TRANSVAGINAL ULTRASOUND OF PELVIS DOPPLER ULTRASOUND OF OVARIES TECHNIQUE: Both transabdominal and transvaginal ultrasound examinations of the pelvis were performed. Transabdominal technique was performed for global imaging of the pelvis including uterus, ovaries, adnexal regions, and  pelvic cul-de-sac. It was necessary to proceed with endovaginal exam following the transabdominal exam to visualize the ovaries. Color and duplex Doppler ultrasound was utilized to evaluate blood flow to the ovaries. COMPARISON:  None. FINDINGS: Uterus Measurements: 9.0 x 4.3 x 5.3 cm = volume: 107 mL. No fibroids or other mass visualized. Endometrium Thickness: 8 mm.  No focal abnormality visualized. Right ovary Measurements: 4.4 x 2.8 x 3.4 cm = volume: 21 mL. There is a complex cyst with surrounding hypervascularity, likely corpus luteum. Left ovary Not visualized. Pulsed Doppler evaluation of the right ovary demonstrates normal low-resistance arterial and venous waveforms. Other findings Small amount of free fluid.  IMPRESSION: 1. Enlarged right ovary with complex cystic structure, likely corpus luteum cyst. 2. Normal blood flow to the right ovary. 3. Left ovary not visualized. Electronically Signed   By: Deatra RobinsonKevin  Herman M.D.   On: 03/25/2019 01:06   [place] Pelvis Complete  Result Date: 03/25/2019 CLINICAL DATA:  Right adnexal pain EXAM: TRANSABDOMINAL AND TRANSVAGINAL ULTRASOUND OF PELVIS DOPPLER ULTRASOUND OF OVARIES TECHNIQUE: Both transabdominal and transvaginal ultrasound examinations of the pelvis were performed. Transabdominal technique was performed for global imaging of the pelvis including uterus, ovaries, adnexal regions, and pelvic cul-de-sac. It was necessary to proceed with endovaginal exam following the transabdominal exam to visualize the ovaries. Color and duplex Doppler ultrasound was utilized to evaluate blood flow to the ovaries. COMPARISON:  None. FINDINGS: Uterus Measurements: 9.0 x 4.3 x 5.3 cm = volume: 107 mL. No fibroids or other mass visualized. Endometrium Thickness: 8 mm.  No focal abnormality visualized. Right ovary Measurements: 4.4 x 2.8 x 3.4 cm = volume: 21 mL. There is a complex cyst with surrounding hypervascularity, likely corpus luteum. Left ovary Not visualized. Pulsed Doppler  evaluation of the right ovary demonstrates normal low-resistance arterial and venous waveforms. Other findings Small amount of free fluid. IMPRESSION: 1. Enlarged right ovary with complex cystic structure, likely corpus luteum cyst. 2. Normal blood flow to the right ovary. 3. Left ovary not visualized. Electronically Signed   By: Deatra RobinsonKevin  Herman M.D.   On: 03/25/2019 01:06   [place] Art/ven Flow Abd Pelv Doppler  Result Date: 03/25/2019 CLINICAL DATA:  Right adnexal pain EXAM: TRANSABDOMINAL AND TRANSVAGINAL ULTRASOUND OF PELVIS DOPPLER ULTRASOUND OF OVARIES TECHNIQUE: Both transabdominal and transvaginal ultrasound examinations of the pelvis were performed. Transabdominal technique was performed for global imaging of the pelvis including uterus, ovaries, adnexal regions, and pelvic cul-de-sac. It was necessary to proceed with endovaginal exam following the transabdominal exam to visualize the ovaries. Color and duplex Doppler ultrasound was utilized to evaluate blood flow to the ovaries. COMPARISON:  None. FINDINGS: Uterus Measurements: 9.0 x 4.3 x 5.3 cm = volume: 107 mL. No fibroids or other mass visualized. Endometrium Thickness: 8 mm.  No focal abnormality visualized. Right ovary Measurements: 4.4 x 2.8 x 3.4 cm = volume: 21 mL. There is a complex cyst with surrounding hypervascularity, likely corpus luteum. Left ovary Not visualized. Pulsed Doppler evaluation of the right ovary demonstrates normal low-resistance arterial and venous waveforms. Other findings Small amount of free fluid. IMPRESSION: 1. Enlarged right ovary with complex cystic structure, likely corpus luteum cyst. 2. Normal blood flow to the right ovary. 3. Left ovary not visualized. Electronically Signed   By: Deatra RobinsonKevin  Herman M.D.   On: 03/25/2019 01:06    Procedures Procedures (including critical care time)  Medications Ordered in ED Medications  morphine 4 MG/ML injection 4 mg (4 mg Intravenous Given 03/24/19 2309)  ondansetron (ZOFRAN)  injection 4 mg (4 mg Intravenous Given 03/24/19 2309)     Initial Impression / Assessment and Plan / ED Course  I have reviewed the triage vital signs and the nursing notes.  Pertinent labs & imaging results that were available during my care of the patient were reviewed by me and considered in my medical decision making (see chart for details).        Pt with sudden onset of pelvic/right lower quadrant pain with intercourse since yesterday evening.  Concern for possible ovarian torsion vs ruptured ovarian cyst. US ordered.  Sig. Leukocytosis.  US with right complex ovarian cyst, no torsion.  Pt dispo  pending evaluation by edp.   Final Clinical Impressions(s) / ED Diagnoses   Final diagnoses:  Pelvic pain  Cyst of right ovary  Leukocytosis, unspecified type    ED Discharge Orders    None       Victoriano Laindol, Orpha Dain, PA-C 03/25/19 0033    Burgess AmorIdol, Ishia Tenorio, PA-C 03/25/19 0116    Glynn Octaveancour, Stephen, MD 03/25/19 [phone]

## 2019-03-25 ENCOUNTER — Emergency Department (HOSPITAL_COMMUNITY): Payer: Self-pay

## 2019-03-25 MED ORDER — IOHEXOL 300 MG/ML  SOLN
100.0000 mL | Freq: Once | INTRAMUSCULAR | Status: AC | PRN
  Administered 2019-03-25: 100 mL via INTRAVENOUS

## 2019-03-25 MED ORDER — HYDROCODONE-ACETAMINOPHEN 5-325 MG PO TABS: 1.0000 | ORAL_TABLET | ORAL | 0 refills | Status: DC | PRN

## 2019-03-25 MED ORDER — IBUPROFEN 600 MG PO TABS: 600.0000 mg | ORAL_TABLET | Freq: Four times a day (QID) | ORAL | 0 refills | Status: DC | PRN

## 2019-03-25 MED ORDER — FENTANYL CITRATE (PF) 100 MCG/2ML IJ SOLN
50.0000 ug | Freq: Once | INTRAMUSCULAR | Status: AC
Start: 2019-03-25 — End: 2019-03-25
  Administered 2019-03-25: 02:00:00 50 ug via INTRAVENOUS
  Filled 2019-03-25: qty 2

## 2019-03-25 NOTE — Discharge Instructions (Addendum)
Your appendix looks normal on CT scan.  You seem to have a ruptured ovarian cyst.  Take the pain medication as prescribed and follow-up with a gynecologist.  Avoid sexual activity until your pain has resolved.  Return to the ED with worsening right-sided lower abdominal pain, fever, vomiting, any other concerns.

## 2019-03-25 NOTE — ED Provider Notes (Signed)
Patient with pelvic pain onset during intercourse yesterday.  Pain is in the center of her abdomen and right lower abdomen.  Pain improves throughout the day today but became worse again after sex tonight.  Patient resting comfortably.  She has right lower quadrant and suprapubic pubic tenderness with voluntary guarding.  Ultrasound is negative for torsion but does show complex right-sided ovarian cyst.  This is likely source of her pain. Patient does have leukocytosis of 18 and significant right lower quadrant tenderness.  Possibility of appendicitis addressed with patient and she agrees to proceed with CT scan.  Appendix is normal on CT scan.  There is a small amount of hemoperitoneum likely secondary to ruptured ovarian cyst.  Patient is resting comfortably on recheck.  Treat supportively for suspected ruptured ovarian cyst. Pelvic rest. Return to the ED with worsening RLQ pain, fever, vomiting, vaginal bleeding or any other concerns.   Ezequiel Essex, MD 03/25/19 [phone]

## 2019-03-25 NOTE — ED Notes (Signed)
Pt back from US

## 2019-03-26 LAB — GC/CHLAMYDIA PROBE AMP (~~LOC~~) NOT AT ARMC
Chlamydia: NEGATIVE
Neisseria Gonorrhea: NEGATIVE

## 2019-03-29 ENCOUNTER — Telehealth: Payer: Self-pay | Admitting: Adult Health

## 2019-03-29 NOTE — Telephone Encounter (Signed)
Patient called, stated that she has a ruptured ovarian cyst and went to [place] this past weekend.  She's a new pt, self pay and would like an appointment, please advise.  [phone]

## 2019-03-29 NOTE — Telephone Encounter (Signed)
Pt states she was seen at another office. Encounter closed. [place]

## 2019-05-07 ENCOUNTER — Other Ambulatory Visit: Payer: Self-pay

## 2019-05-07 ENCOUNTER — Encounter (HOSPITAL_COMMUNITY): Payer: Self-pay

## 2019-05-07 ENCOUNTER — Emergency Department (HOSPITAL_COMMUNITY)
Admission: EM | Admit: 2019-05-07 | Discharge: 2019-05-07 | Disposition: A | Discharge status: Home / Self Care | Payer: Self-pay | Attending: Emergency Medicine | Admitting: Emergency Medicine

## 2019-05-07 DIAGNOSIS — R112 Nausea with vomiting, unspecified: Secondary | ICD-10-CM | POA: Insufficient documentation

## 2019-05-07 DIAGNOSIS — Z5321 Procedure and treatment not carried out due to patient leaving prior to being seen by health care provider: Secondary | ICD-10-CM | POA: Insufficient documentation

## 2019-05-07 HISTORY — DX: Post-traumatic stress disorder, unspecified: F43.10

## 2019-05-07 NOTE — ED Triage Notes (Signed)
Pt states she snorted an unknown drug approx 1 hr ago, states is was a white powder. Nauseous and vomited 3x times. States she has mood disorders and is sleep deprived. Wants help from outpt mental health facility and a drug test

## 2019-05-09 ENCOUNTER — Other Ambulatory Visit: Payer: Self-pay

## 2019-05-09 ENCOUNTER — Encounter (HOSPITAL_COMMUNITY): Payer: Self-pay

## 2019-05-09 ENCOUNTER — Emergency Department (HOSPITAL_COMMUNITY)
Admission: EM | Admit: 2019-05-09 | Discharge: 2019-05-14 | Disposition: A | Discharge status: Home / Self Care | Payer: Self-pay | Attending: Emergency Medicine | Admitting: Emergency Medicine

## 2019-05-09 DIAGNOSIS — F319 Bipolar disorder, unspecified: Secondary | ICD-10-CM | POA: Insufficient documentation

## 2019-05-09 DIAGNOSIS — F1910 Other psychoactive substance abuse, uncomplicated: Secondary | ICD-10-CM | POA: Insufficient documentation

## 2019-05-09 DIAGNOSIS — R451 Restlessness and agitation: Secondary | ICD-10-CM

## 2019-05-09 DIAGNOSIS — F17210 Nicotine dependence, cigarettes, uncomplicated: Secondary | ICD-10-CM | POA: Insufficient documentation

## 2019-05-09 DIAGNOSIS — R45850 Homicidal ideations: Secondary | ICD-10-CM

## 2019-05-09 LAB — CBC WITH DIFFERENTIAL/PLATELET
Abs Immature Granulocytes: 0.02 10*3/uL (ref 0.00–0.07)
Basophils Absolute: 0 10*3/uL (ref 0.0–0.1)
Basophils Relative: 0 %
Eosinophils Absolute: 0.1 10*3/uL (ref 0.0–0.5)
Eosinophils Relative: 1 %
HCT: 38.3 % (ref 36.0–46.0)
Hemoglobin: 13 g/dL (ref 12.0–15.0)
Immature Granulocytes: 0 %
Lymphocytes Relative: 19 %
Lymphs Abs: 2.2 10*3/uL (ref 0.7–4.0)
MCH: 31.3 pg (ref 26.0–34.0)
MCHC: 33.9 g/dL (ref 30.0–36.0)
MCV: 92.3 fL (ref 80.0–100.0)
Monocytes Absolute: 0.9 10*3/uL (ref 0.1–1.0)
Monocytes Relative: 7 %
Neutro Abs: 8.4 10*3/uL — ABNORMAL HIGH (ref 1.7–7.7)
Neutrophils Relative %: 73 %
Platelets: 273 10*3/uL (ref 150–400)
RBC: 4.15 MIL/uL (ref 3.87–5.11)
RDW: 11.6 % (ref 11.5–15.5)
WBC: 11.7 10*3/uL — ABNORMAL HIGH (ref 4.0–10.5)
nRBC: 0 % (ref 0.0–0.2)

## 2019-05-09 LAB — COMPREHENSIVE METABOLIC PANEL
ALT: 23 U/L (ref 0–44)
AST: 43 U/L — ABNORMAL HIGH (ref 15–41)
Albumin: 4.2 g/dL (ref 3.5–5.0)
Alkaline Phosphatase: 37 U/L — ABNORMAL LOW (ref 38–126)
Anion gap: 10 (ref 5–15)
BUN: 12 mg/dL (ref 6–20)
CO2: 23 mmol/L (ref 22–32)
Calcium: 9.2 mg/dL (ref 8.9–10.3)
Chloride: 106 mmol/L (ref 98–111)
Creatinine, Ser: 1.03 mg/dL — ABNORMAL HIGH (ref 0.44–1.00)
GFR calc Af Amer: >60 mL/min (ref >60)
GFR calc non Af Amer: >60 mL/min (ref >60)
Glucose, Bld: 128 mg/dL — ABNORMAL HIGH (ref 70–99)
Potassium: 3 mmol/L — ABNORMAL LOW (ref 3.5–5.1)
Sodium: 139 mmol/L (ref 135–145)
Total Bilirubin: 1.2 mg/dL (ref 0.3–1.2)
Total Protein: 6.9 g/dL (ref 6.5–8.1)

## 2019-05-09 LAB — BASIC METABOLIC PANEL
Anion gap: 5 (ref 5–15)
BUN: 11 mg/dL (ref 6–20)
CO2: 24 mmol/L (ref 22–32)
Calcium: 7.9 mg/dL — ABNORMAL LOW (ref 8.9–10.3)
Chloride: 113 mmol/L — ABNORMAL HIGH (ref 98–111)
Creatinine, Ser: 0.89 mg/dL (ref 0.44–1.00)
GFR calc Af Amer: >60 mL/min (ref >60)
GFR calc non Af Amer: >60 mL/min (ref >60)
Glucose, Bld: 84 mg/dL (ref 70–99)
Potassium: 3.6 mmol/L (ref 3.5–5.1)
Sodium: 142 mmol/L (ref 135–145)

## 2019-05-09 LAB — LIPASE, BLOOD: Lipase: 28 U/L (ref 11–51)

## 2019-05-09 LAB — CK
Total CK: 1880 U/L — ABNORMAL HIGH (ref 38–234)
Total CK: 1904 U/L — ABNORMAL HIGH (ref 38–234)
Total CK: 1611 U/L — ABNORMAL HIGH (ref 38–234)

## 2019-05-09 LAB — I-STAT BETA HCG BLOOD, ED (MC, WL, AP ONLY): I-stat hCG, quantitative: <5 m[IU]/mL (ref <5)

## 2019-05-09 LAB — LITHIUM LEVEL: Lithium Lvl: 0.15 mmol/L — ABNORMAL LOW (ref 0.60–1.20)

## 2019-05-09 MED ORDER — LORAZEPAM 2 MG/ML IJ SOLN
INTRAMUSCULAR | Status: AC
  Administered 2019-05-09: 2 mg via INTRAMUSCULAR
  Filled 2019-05-09: qty 1

## 2019-05-09 MED ORDER — ZIPRASIDONE MESYLATE 20 MG IM SOLR
INTRAMUSCULAR | Status: AC
  Administered 2019-05-09: 20 mg via INTRAMUSCULAR
  Filled 2019-05-09: qty 20

## 2019-05-09 MED ORDER — ALUM & MAG HYDROXIDE-SIMETH 200-200-20 MG/5ML PO SUSP: 30.0000 mL | Freq: Four times a day (QID) | ORAL | Status: DC | PRN

## 2019-05-09 MED ORDER — STERILE WATER FOR INJECTION IJ SOLN
INTRAMUSCULAR | Status: AC
  Administered 2019-05-09: 20:00:00 1.2 mL
  Filled 2019-05-09: qty 10

## 2019-05-09 MED ORDER — POTASSIUM CHLORIDE 10 MEQ/100ML IV SOLN
10.0000 meq | INTRAVENOUS | Status: AC
  Administered 2019-05-09 (×4): 10 meq via INTRAVENOUS
  Filled 2019-05-09 (×4): qty 100

## 2019-05-09 MED ORDER — LACTATED RINGERS IV SOLN
INTRAVENOUS | Status: DC
  Administered 2019-05-09: 19:00:00 via INTRAVENOUS

## 2019-05-09 MED ORDER — DIPHENHYDRAMINE HCL 50 MG/ML IJ SOLN
INTRAMUSCULAR | Status: AC
  Administered 2019-05-09: 15:00:00 via INTRAMUSCULAR
  Filled 2019-05-09: qty 1

## 2019-05-09 MED ORDER — ACETAMINOPHEN 325 MG PO TABS
650.0000 mg | ORAL_TABLET | ORAL | Status: DC | PRN
  Administered 2019-05-10: 650 mg via ORAL
  Filled 2019-05-09: qty 2

## 2019-05-09 MED ORDER — SODIUM CHLORIDE 0.9 % IV BOLUS
1000.0000 mL | Freq: Once | INTRAVENOUS | Status: AC
  Administered 2019-05-09: 1000 mL via INTRAVENOUS
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
## 2019-05-09 NOTE — ED Notes (Signed)
TTS Clinician attempted, per Nira Conn, RN, patient is incoherent unable to complete assessment and try in the a.m.

## 2019-05-09 NOTE — ED Notes (Signed)
Pt sleeping.  Arouses to voice.  Pt allowed staff to obtain vitals and start IV.  EDP aware.
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
## 2019-05-09 NOTE — ED Notes (Signed)
Pt starting to calm down somewhat.  2 deputies at bedside and 4 hospital staff.

## 2019-05-09 NOTE — ED Notes (Signed)
EDP Kenadie Royce notified that patient will not straighten arm so that the LR can continue into IV. EDP states to stop the infusion and that the patient no longer needs to be on the cardiac monitor since the potassium is finished. This nurse will stop LR. Total of 2.5 L of fluids in.

## 2019-05-10 DIAGNOSIS — R45850 Homicidal ideations: Secondary | ICD-10-CM

## 2019-05-10 DIAGNOSIS — F1910 Other psychoactive substance abuse, uncomplicated: Secondary | ICD-10-CM

## 2019-05-10 DIAGNOSIS — R451 Restlessness and agitation: Secondary | ICD-10-CM

## 2019-05-10 LAB — RAPID URINE DRUG SCREEN, HOSP PERFORMED
Amphetamines: NOT DETECTED
Barbiturates: NOT DETECTED
Benzodiazepines: NOT DETECTED
Cocaine: NOT DETECTED
Opiates: NOT DETECTED
Tetrahydrocannabinol: POSITIVE — AB

## 2019-05-10 LAB — POC URINE PREG, ED: Preg Test, Ur: NEGATIVE

## 2019-05-10 MED ORDER — HALOPERIDOL LACTATE 5 MG/ML IJ SOLN
5.0000 mg | Freq: Once | INTRAMUSCULAR | Status: AC
  Administered 2019-05-10: 5 mg via INTRAMUSCULAR
  Filled 2019-05-10: qty 1

## 2019-05-10 MED ORDER — OLANZAPINE 5 MG PO TABS
5.0000 mg | ORAL_TABLET | Freq: Two times a day (BID) | ORAL | Status: DC
  Administered 2019-05-10 – 2019-05-14 (×6): 5 mg via ORAL
  Filled 2019-05-10 (×7): qty 1

## 2019-05-10 MED ORDER — NICOTINE 14 MG/24HR TD PT24
14.0000 mg | MEDICATED_PATCH | Freq: Once | TRANSDERMAL | Status: AC
  Administered 2019-05-10: 14 mg via TRANSDERMAL
  Filled 2019-05-10: qty 1

## 2019-05-10 MED ORDER — NICOTINE 21 MG/24HR TD PT24
21.0000 mg | MEDICATED_PATCH | Freq: Once | TRANSDERMAL | Status: AC
  Administered 2019-05-10: 21 mg via TRANSDERMAL
  Filled 2019-05-10: qty 1

## 2019-05-10 MED ORDER — ZIPRASIDONE MESYLATE 20 MG IM SOLR
20.0000 mg | Freq: Once | INTRAMUSCULAR | Status: AC
  Administered 2019-05-10: 20 mg via INTRAMUSCULAR
  Filled 2019-05-10: qty 20

## 2019-05-10 NOTE — ED Notes (Signed)
Pt is tearful, Sheriff at bedside.

## 2019-05-10 NOTE — ED Notes (Addendum)
Patient given two warm blankets. Patient resting at this time.

## 2019-05-10 NOTE — ED Notes (Signed)
Officers changed and switching shackles. Pt became very combative while attempting to switch shackles. Crying saying she is scared of men

## 2019-05-10 NOTE — ED Notes (Signed)
TTS in progress 
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
## 2019-05-10 NOTE — ED Notes (Signed)
Pt still screaming at staff.  Pt wanting a nicotine patch.  RN got a verbal order for nicotine patch. when RN returned to room, Pt standing beside bed, torn all covers off the bed as well as her scrub top.

## 2019-05-10 NOTE — ED Notes (Signed)
Pt was having the handcuff switched by the officers. Pt managed to get out of the handcuff and ran out of the room, running into staff.  Officers stopped patient and took her back to the room. Pt screaming, cussing at staff.

## 2019-05-10 NOTE — BH Assessment (Addendum)
Tele Assessment Note   Patient Name: Carol Miles MRN: [id number] Referring Physician: Dr. Alona BeneJoshua Long, MD Location of Patient: [facility] Location of Provider: Behavioral Health TTS Department  Maryann AlarElsbeth Sherron FlemingsM Nong is a 28 y.o. female who was brought to [facility] via [facility] due to [facility] wearing a bra and underwear and that, when they arrived, she took off her bra and began yelling, cursing, and making threats. [facility] reported pt had been using crystal meth, fentanyl, and xanax. Pt denies SI, though she acknowledged she does experience SI at times. She states she has attempted to kill herself 2x in the past, though she is unable to identify when the most recent incident was. Pt states she used methamphetamine and fentanyl yesterday (05/09/2019). She denies HI, AVH, and engagement in the legal system. Pt states she does have access to guns, as her boyfriend's father owns guns and she is currently residing in their home.  Pt declined to provide the name or numbers of any friends or family clinician could contact for collateral.  Pt is oriented x4. Her recent and remote memory is overall intact, though her recall is slow at times. Pt was cooperative throughout the assessment process, though she was manic and difficult to re-direct at times. Pt's insight, judgement, and impulse control is impaired at this time.   Diagnosis: F91.3, Bipolar I disorder, Current or most recent episode unspecified   Past Medical History:  Past Medical History:  Diagnosis Date  . Bipolar 1 disorder (HCC)   . Depression   . HPV (human papilloma virus) anogenital infection   . PTSD (post-traumatic stress disorder)     Past Surgical History:  Procedure Laterality Date  . HERNIA REPAIR      Family History:  Family History  Problem Relation Age of Onset  . OCD Brother     Social History:  reports that she has been smoking cigarettes. She has a 6.00 pack-year smoking history.  She has never used smokeless tobacco. She reports current alcohol use. She reports current drug use.  Additional Social History:  Alcohol / Drug Use Pain Medications: Please see MAR Prescriptions: Please see MAR Over the Counter: Please see MAR History of alcohol / drug use?: Yes Longest period of sobriety (when/how long): Unknown Substance #1 Name of Substance 1: Fentanyl 1 - Age of First Use: 27 1 - Amount (size/oz): Unknown 1 - Frequency: Unknown 1 - Duration: Unknown 1 - Last Use / Amount: 05/09/2019 Substance #2 Name of Substance 2: Methamphetamine 2 - Age of First Use: 27 2 - Amount (size/oz): "An aerated puff" 2 - Frequency: Unknown 2 - Duration: Unknown 2 - Last Use / Amount: 05/09/2019 Substance #3 Name of Substance 3: Xanax 3 - Age of First Use: Unknown 3 - Amount (size/oz): Unknown 3 - Frequency: Unknown 3 - Duration: Unknown 3 - Last Use / Amount: Unknown Substance #4 Name of Substance 4: Cannabis 4 - Age of First Use: Unknown 4 - Amount (size/oz): Unknown 4 - Frequency: Unknown 4 - Duration: Unknown 4 - Last Use / Amount: Unknown  CIWA: CIWA-Ar BP: 106/78 Pulse Rate: 66 COWS:    Allergies:  Allergies  Allergen Reactions  . Amoxicillin Hives  . Penicillins Hives    Childhood allergy-patient remembers taking Amoxicillin with no side effects. Did it involve swelling of the face/tongue/throat, SOB, or low BP? No Did it involve sudden or severe rash/hives, skin peeling, or any reaction on the inside of your  mouth or nose? No Did you need to seek medical attention at a hospital or doctor's office? Unknown When did it last happen?childhood If all above answers are "NO", may proceed with cephalosporin use.   . Sulfa Antibiotics Nausea And Vomiting    Home Medications: (Not in a hospital admission)   OB/GYN Status:  Patient's last menstrual period was 04/23/2019 (approximate).  General Assessment Data Assessment unable to be completed:  Yes Reason for not completing assessment: (patient is incoherent) Location of Assessment: [facility] TTS Assessment: In system Is this a Tele or Face-to-Face Assessment?: Tele Assessment Is this an Initial Assessment or a Re-assessment for this encounter?: Initial Assessment Patient Accompanied by:: N/A Language Other than English: No Living Arrangements: Other (Comment)(Pt lives with her boyfriend's parents) What gender do you identify as?: Female Marital status: Long term relationship Maiden name: Mckeag Pregnancy Status: No Living Arrangements: Other (Comment)(Pt has been staying wtih her boyfriend and his parents) Can pt return to current living arrangement?: Yes Admission Status: Involuntary Petitioner: ED Attending Is patient capable of signing voluntary admission?: Yes Referral Source: MD Insurance type: None     Crisis Care Plan Living Arrangements: Other (Comment)(Pt has been staying wtih her boyfriend and his parents) Legal Guardian: Other:(Self) Name of Psychiatrist: None Name of Therapist: Jinny SandersAmanda - [facility]  Education Status Is patient currently in school?: No Is the patient employed, unemployed or receiving disability?: Unemployed  Risk to self with the past 6 months Suicidal Ideation: No Has patient been a risk to self within the past 6 months prior to admission? : Other (comment)(Unknown--pt cannot remember last suicide attempt) Suicidal Intent: No Has patient had any suicidal intent within the past 6 months prior to admission? : Other (comment)(Unknown--pt cannot remember last suicide attempt) Is patient at risk for suicide?: No Suicidal Plan?: No Has patient had any suicidal plan within the past 6 months prior to admission? : Other (comment)(Unknown--pt cannot remember last suicide attempt) Access to Means: No What has been your use of drugs/alcohol within the last 12 months?: Pt acknowledges fentanyl, methamphetamine, Xanax abuse Previous Attempts/Gestures:  Yes How many times?: 2 Other Self Harm Risks: Pt took an unknown substance 3 days ago Triggers for Past Attempts: Unknown Intentional Self Injurious Behavior: None Family Suicide History: Unable to assess Recent stressful life event(s): Conflict (Comment)(Pt noted conflict with her boyfriend's father) Persecutory voices/beliefs?: No Depression: Yes Depression Symptoms: Insomnia, Fatigue, Feeling worthless/self pity, Feeling angry/irritable Substance abuse history and/or treatment for substance abuse?: Yes Suicide prevention information given to non-admitted patients: Not applicable  Risk to Others within the past 6 months Homicidal Ideation: No Does patient have any lifetime risk of violence toward others beyond the six months prior to admission? : No Thoughts of Harm to Others: No Current Homicidal Intent: No Current Homicidal Plan: No Access to Homicidal Means: No Identified Victim: None noted History of harm to others?: No Assessment of Violence: On admission Violent Behavior Description: None noted Does patient have access to weapons?: Yes (Comment)(Pt shares her boyfriend's father has a gun) Criminal Charges Pending?: No Does patient have a court date: No Is patient on probation?: No  Psychosis Hallucinations: None noted Delusions: None noted  Mental Status Report Appearance/Hygiene: Bizarre, In scrubs Eye Contact: Fair Motor Activity: Hyperactivity, Other (Comment)(Pt can't sit down, is constantly moving) Speech: Rapid Level of Consciousness: Alert Mood: Anxious Affect: Appropriate to circumstance Anxiety Level: Moderate Thought Processes: Flight of Ideas, Circumstantial Judgement: Impaired Orientation: Person, Time, Place, Situation Obsessive Compulsive Thoughts/Behaviors: Moderate  Cognitive Functioning Concentration: Poor Memory: Unable to Assess Is patient IDD: No Insight: Poor Impulse Control: Poor Appetite: Good Have you had any weight changes? : No  Change Sleep: Decreased Total Hours of Sleep: (UTA) Vegetative Symptoms: None  ADLScreening [facility] Assessment Services) Patient's cognitive ability adequate to safely complete daily activities?: Yes Patient able to express need for assistance with ADLs?: Yes Independently performs ADLs?: Yes (appropriate for developmental age)  Prior Inpatient Therapy Prior Inpatient Therapy: (UTA)  Prior Outpatient Therapy Prior Outpatient Therapy: No Does patient have an ACCT team?: No Does patient have Intensive In-House Services?  : No Does patient have [facility] services? : No Does patient have [facility] services?: No  ADL Screening (condition at time of admission) Patient's cognitive ability adequate to safely complete daily activities?: Yes Is the patient deaf or have difficulty hearing?: No Does the patient have difficulty seeing, even when wearing glasses/contacts?: No Does the patient have difficulty concentrating, remembering, or making decisions?: Yes Patient able to express need for assistance with ADLs?: Yes Does the patient have difficulty dressing or bathing?: No Independently performs ADLs?: Yes (appropriate for developmental age) Does the patient have difficulty walking or climbing stairs?: No Weakness of Legs: None  Home Assistive Devices/Equipment Home Assistive Devices/Equipment: (UTA)  Therapy Consults (therapy consults require a physician order) PT Evaluation Needed: No OT Evalulation Needed: No SLP Evaluation Needed: No Abuse/Neglect Assessment (Assessment to be complete while patient is alone) Abuse/Neglect Assessment Can Be Completed: Unable to assess, patient is non-responsive or altered mental status Values / Beliefs Cultural Requests During Hospitalization: None Spiritual Requests During Hospitalization: None Consults Spiritual Care Consult Needed: No Social Work Consult Needed: No Regulatory affairs officer (For Healthcare) Does Patient Have a Medical Advance Directive?:  Unable to assess, patient is non-responsive or altered mental status        Disposition: Lindon Romp, NP, reviewed pt's chart and information and determined pt should be observed overnight for safety and stability and re-assessed in the morning. This information was provided to pt's nurse, Haliimaile, at [phone].   Disposition Initial Assessment Completed for this Encounter: Yes Patient referred to: Other (Comment)(Pt will be observed overnight for safety and stability)  This service was provided via telemedicine using a 2-way, interactive audio and video technology.  Names of all persons participating in this telemedicine service and their role in this encounter. Name: Rosalyn Charters Role: Patient  Name: Lindon Romp Role: Nurse Practitioner  Name: Windell Hummingbird Role: Clinician    Dannielle Burn 05/10/2019 1:52 AM

## 2019-05-10 NOTE — ED Notes (Addendum)
Patient has one hand and leg cuffed to the bed at this time. [facility] aslo with patient at this time.
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
## 2019-05-10 NOTE — ED Notes (Signed)
TTS machine at bedside. 

## 2019-05-10 NOTE — ED Notes (Signed)
TTS in process 

## 2019-05-10 NOTE — ED Notes (Signed)
Patient awake at this time.Patient acting up, yelling at staff and Energy manager.

## 2019-05-10 NOTE — ED Notes (Signed)
Patient awake at this time sitting up in bed wanting to have cuffs removed. Patient asking for pain medication stating that she does not feel well. Patient wanting to speak with Officer, Officer in room patient started yelling that she was scared of him and to get out of her room. Advised patient that she needed to stop yelling and waking up other patients in the department.

## 2019-05-10 NOTE — ED Notes (Signed)
Pt screaming at staff, cussing at this RN. Pt stated " get the fuck out of my room you white cunt bitch" The RN asked the pt to stop screaming and yelling. Pt started pulling on the bed and still screaming.

## 2019-05-10 NOTE — ED Notes (Signed)
Nurse in room and talking with pt. Pt was given a cup of water. Pt suddenly crushed glass of water and ice and threw in bed.

## 2019-05-10 NOTE — ED Notes (Signed)
Pt screaming in room and yelling a prayer. Pt cussing at staff and officer. RN tried to explain to the patient that she cannot scream and yell. Pt was telling this RN to " shut the fuck up"

## 2019-05-11 ENCOUNTER — Encounter (HOSPITAL_COMMUNITY): Payer: Self-pay

## 2019-05-11 MED ORDER — LORAZEPAM 1 MG PO TABS
1.0000 mg | ORAL_TABLET | ORAL | Status: AC | PRN
  Administered 2019-05-12: 18:00:00 1 mg via ORAL
  Filled 2019-05-11: qty 1

## 2019-05-11 MED ORDER — ZIPRASIDONE MESYLATE 20 MG IM SOLR
20.0000 mg | INTRAMUSCULAR | Status: AC | PRN
  Administered 2019-05-11: 20 mg via INTRAMUSCULAR
  Filled 2019-05-11: qty 20

## 2019-05-11 MED ORDER — ZIPRASIDONE MESYLATE 20 MG IM SOLR
20.0000 mg | Freq: Once | INTRAMUSCULAR | Status: AC
  Administered 2019-05-12: 20 mg via INTRAMUSCULAR
  Filled 2019-05-11: qty 20

## 2019-05-11 MED ORDER — STERILE WATER FOR INJECTION IJ SOLN
INTRAMUSCULAR | Status: AC
  Administered 2019-05-11: 10 mL
  Filled 2019-05-11: qty 10

## 2019-05-11 MED ORDER — OLANZAPINE 5 MG PO TBDP
10.0000 mg | ORAL_TABLET | Freq: Three times a day (TID) | ORAL | Status: DC | PRN
  Filled 2019-05-11: qty 2

## 2019-05-11 MED ORDER — STERILE WATER FOR INJECTION IJ SOLN
INTRAMUSCULAR | Status: AC
  Filled 2019-05-11: qty 10

## 2019-05-11 NOTE — ED Notes (Signed)
Patient cursing again and police officer advised patient we could not have her outburst in the E.R. and that other patients were hearing her offensive language

## 2019-05-11 NOTE — ED Notes (Signed)
Patient repeatedly cursing at staff. Pt yelling "fuck" continuously. Pt is pacing her room at this time.

## 2019-05-11 NOTE — ED Notes (Signed)
This morning patient took all the sheets off her bed and threw them out the door.  She stripped out of her paper scrubs.  New scrubs given.  Patient's bed was made again.  Patient was advised she must keep her clothes on.

## 2019-05-11 NOTE — ED Notes (Signed)
Patient yelled "fuck" again very loudly, and threw drink into the floor.
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
## 2019-05-11 NOTE — ED Notes (Signed)
Gave pt graham crackers with peanut butter and sprite.

## 2019-05-11 NOTE — ED Notes (Signed)
Placed TTS cart in pt room. Pt said she understands how it works. As I walked out of room, patient said "Thank you Carol Miles, fuck you [name]".

## 2019-05-11 NOTE — ED Notes (Signed)
Patient yelling in room stating that staff have lied to her and accused her of things that she is not doing. Pt redirectable at this time.

## 2019-05-11 NOTE — ED Notes (Signed)
After her TTS patient became uncooperative, cursing, standing outside the room, would not follow instructions.  Officer had to shackle patient back to bed.  Patient requested snack and drink.  Patient threw drink in the floor.

## 2019-05-11 NOTE — ED Notes (Signed)
PT states she has not been taking her birth control and that is the reason she is so moody.

## 2019-05-11 NOTE — BH Assessment (Signed)
[facility] Assessment Progress Note Per Starks FNP this writer attempted to contact patient's partner Diona Foley [phone] to gather collateral information to assist in rendering disposition. Collateral could not be obtained at this time, patient will continue to be monitored until information can be obtained.

## 2019-05-11 NOTE — ED Notes (Signed)
Patient attempted to come out of room. [employer] instucted pt to go back to room. Pt cursing at staff.

## 2019-05-11 NOTE — ED Notes (Signed)
Patient shouting, screaming obscenities in room.  Patient asked by this nurse to mind her voice and manners.  Patient sitting in bed quiet at this time.

## 2019-05-12 MED ORDER — ZIPRASIDONE MESYLATE 20 MG IM SOLR
INTRAMUSCULAR | Status: AC
  Filled 2019-05-12: qty 20

## 2019-05-12 MED ORDER — LORAZEPAM 2 MG/ML IJ SOLN
1.0000 mg | Freq: Once | INTRAMUSCULAR | Status: AC
  Administered 2019-05-12: 1 mg via INTRAMUSCULAR
  Filled 2019-05-12: qty 1

## 2019-05-12 MED ORDER — STERILE WATER FOR INJECTION IJ SOLN
INTRAMUSCULAR | Status: AC
  Filled 2019-05-12: qty 10

## 2019-05-12 MED ORDER — LITHIUM CARBONATE 300 MG PO CAPS
300.0000 mg | ORAL_CAPSULE | Freq: Two times a day (BID) | ORAL | Status: DC
  Administered 2019-05-12 – 2019-05-14 (×4): 300 mg via ORAL
  Filled 2019-05-12 (×4): qty 1

## 2019-05-12 NOTE — ED Notes (Signed)
75% of meal tray consumed.

## 2019-05-12 NOTE — ED Notes (Signed)
Pt says she wants to slam her head into the wall due to being "locked up". She states she hears [place] all bad mouthing the patients in front of her and states "I will be getting my unemployment back and I will have a couple grand to waste on [place] [facility]". Pt is currently pacing the room.

## 2019-05-12 NOTE — ED Notes (Signed)
Meal tray and tea given

## 2019-05-12 NOTE — ED Notes (Signed)
PT was offered a shower and when the officer went to remove shackles she became verbally aggressive and refused a shower or linen change and went back to bed.

## 2019-05-12 NOTE — BH Assessment (Signed)
[facility] Assessment Progress Note  Patient was seen for re-assessment.  She continues to be manic and disorganized. Patient states that she slept better last night with medications.  She states that prior to her arrival to the hospital that she had not slept for several days. Upon beginning the assessment, her mood was initially stable, but after talking to her for awhile, she became very labile, her speech became pressured and then she started crying.  TTS continues to recommend inpatient treatment for her.Marland Kitchen
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
## 2019-05-12 NOTE — ED Notes (Signed)
Pt finished with ph call

## 2019-05-12 NOTE — ED Notes (Signed)
"  Adam" called to check on patient.  Given [facility] phone number to call. Adam made aware [facility] attempt contact.

## 2019-05-12 NOTE — ED Notes (Signed)
Pts Mother called to speak with pt, pt requests to speak with her, pt notified this is last ph call allowed for today
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
## 2019-05-12 NOTE — ED Notes (Signed)
Pt yelling from room at staff and officer. md aware, orders place. See MAR.

## 2019-05-12 NOTE — ED Notes (Signed)
Pt given clean scrubs and changed at this time

## 2019-05-12 NOTE — ED Notes (Signed)
Pt sat up in bed and began to scream "adam." pt then got out of bed and stood at door asking for this "adam." pt able to be reoriented and now back in bed and resting.

## 2019-05-12 NOTE — ED Notes (Signed)
Pt becoming more agitated, pacing in room, doing sit ups in bed, making bed and taking blanket back off to re-make bed again, pt asked to use phone, advised nurse in another room and will report phone request once he is available

## 2019-05-12 NOTE — ED Notes (Signed)
Pt allowed to use phone, pt tearful while using phone

## 2019-05-12 NOTE — Progress Notes (Signed)
Patient meets criteria for inpatient treatment. No appropriate or available beds at [facility]. CSW faxed referrals to the following facilities for review:  [facility]  [place]   TTS will continue to seek bed placement.  Chalmers Guest. Guerry Bruin, MSW, [place] Work/Disposition Phone: [phone] Fax: [phone]

## 2019-05-13 NOTE — ED Notes (Signed)
Patient exhibiting splitting behavior saying that she does not trust [place] and will not take anything from [place]. Then express anger that we have not given her regular medications.  Patient then told me that she had not gotten her regular medication at all since being here. I told her that she has been getting her regular medication every day and on time. She then turned and said she would take it denying that she refused just before.  Patient given evening meds on time.

## 2019-05-13 NOTE — BHH Counselor (Signed)
Pt remains at the ED due to altered mental status.  Pt was reassessed this AM.  She was irritable in affect, complained that she was interrupted, and she stated that she wanted to harm author.  Pt denied SI, HI, and AVH.  Per notes, Pt exhibits continued irritability and agitation.  Recommend continued inpatient treatment.

## 2019-05-14 LAB — LITHIUM LEVEL: Lithium Lvl: 0.24 mmol/L — ABNORMAL LOW (ref 0.60–1.20)

## 2019-05-14 NOTE — ED Provider Notes (Signed)
Patient was evaluated this morning by behavioral health again and behavioral health stated the patient was psych cleared and can be discharged from a psychiatric standpoint   Milton Ferguson, MD 05/14/19 1022

## 2019-05-14 NOTE — Progress Notes (Signed)
   Reassessment: In brief, Carol Miles a 28 y.o.femalewho was admitted to [facility] via [facility] due to pt being found at [facility] wearing a bra and underwear and that, when they arrived, she took off her bra and began yelling, cursing, and making threats. [facility] reported pt had been using crystal meth, fentanyl, and xanax.    During this assessment, she is alert and oriented x3 and cooperative. She appears a lot more calmer compared to the initial psychiatric consultation. There has been reports of irritability and agitation while she has been at the hospital although she reports feeling this way because she is hospitalized and she feels ready to be discharged. She denies active or passive suicidal thoughts, homicidal ideas or psychotic symptoms. She is less manic although continues to present with some mood lability. Her speech is less pressured. As noted, she does have a history of polysubstance abuse and on the day of her admission, she acknowledged using crystal meth, fentanyl and xanax. Patient continued to decline information for other contacts for collateral information.    Patient has been hospitalized since 05/09/2019. Based on her presentation today, some improvement is noted. She is being psychiatrically cleared at this time. Spoke with ED provider Dr. Roderic Palau and updated him on current disposition.

## 2019-05-14 NOTE — ED Notes (Signed)
Patient keeps putting blanket over head asked her several times to uncover head, she didn't comply. I notified Charge nurse Gerda Diss RN she spoke with patient. She uncovered her head. Her TTS is in Progress.

## 2019-05-14 NOTE — Discharge Instructions (Addendum)
Follow-up as instructed by behavioral health 

## 2019-05-17 ENCOUNTER — Other Ambulatory Visit: Payer: Self-pay

## 2019-05-17 ENCOUNTER — Emergency Department (HOSPITAL_COMMUNITY)
Admission: EM | Admit: 2019-05-17 | Discharge: 2019-05-18 | Disposition: A | Discharge status: Home / Self Care | Payer: Self-pay | Attending: Emergency Medicine | Admitting: Emergency Medicine

## 2019-05-17 ENCOUNTER — Encounter (HOSPITAL_COMMUNITY): Payer: Self-pay

## 2019-05-17 DIAGNOSIS — F319 Bipolar disorder, unspecified: Secondary | ICD-10-CM | POA: Insufficient documentation

## 2019-05-17 DIAGNOSIS — Z8659 Personal history of other mental and behavioral disorders: Secondary | ICD-10-CM

## 2019-05-17 DIAGNOSIS — F29 Unspecified psychosis not due to a substance or known physiological condition: Secondary | ICD-10-CM | POA: Insufficient documentation

## 2019-05-17 DIAGNOSIS — F17210 Nicotine dependence, cigarettes, uncomplicated: Secondary | ICD-10-CM | POA: Insufficient documentation

## 2019-05-17 DIAGNOSIS — F4310 Post-traumatic stress disorder, unspecified: Secondary | ICD-10-CM | POA: Insufficient documentation

## 2019-05-17 DIAGNOSIS — Z79899 Other long term (current) drug therapy: Secondary | ICD-10-CM | POA: Insufficient documentation

## 2019-05-17 DIAGNOSIS — R45851 Suicidal ideations: Secondary | ICD-10-CM | POA: Insufficient documentation

## 2019-05-17 DIAGNOSIS — Z20828 Contact with and (suspected) exposure to other viral communicable diseases: Secondary | ICD-10-CM | POA: Insufficient documentation

## 2019-05-17 LAB — COMPREHENSIVE METABOLIC PANEL
ALT: 21 U/L (ref 0–44)
AST: 19 U/L (ref 15–41)
Albumin: 4.3 g/dL (ref 3.5–5.0)
Alkaline Phosphatase: 42 U/L (ref 38–126)
Anion gap: 10 (ref 5–15)
BUN: 9 mg/dL (ref 6–20)
CO2: 25 mmol/L (ref 22–32)
Calcium: 9.2 mg/dL (ref 8.9–10.3)
Chloride: 104 mmol/L (ref 98–111)
Creatinine, Ser: 0.85 mg/dL (ref 0.44–1.00)
GFR calc Af Amer: >60 mL/min (ref >60)
GFR calc non Af Amer: >60 mL/min (ref >60)
Glucose, Bld: 100 mg/dL — ABNORMAL HIGH (ref 70–99)
Potassium: 3.7 mmol/L (ref 3.5–5.1)
Sodium: 139 mmol/L (ref 135–145)
Total Bilirubin: 1 mg/dL (ref 0.3–1.2)
Total Protein: 7.3 g/dL (ref 6.5–8.1)

## 2019-05-17 LAB — RAPID URINE DRUG SCREEN, HOSP PERFORMED
Amphetamines: NOT DETECTED
Barbiturates: NOT DETECTED
Benzodiazepines: NOT DETECTED
Cocaine: NOT DETECTED
Opiates: NOT DETECTED
Tetrahydrocannabinol: POSITIVE — AB

## 2019-05-17 LAB — CBC
HCT: 40.6 % (ref 36.0–46.0)
Hemoglobin: 13.9 g/dL (ref 12.0–15.0)
MCH: 32 pg (ref 26.0–34.0)
MCHC: 34.2 g/dL (ref 30.0–36.0)
MCV: 93.3 fL (ref 80.0–100.0)
Platelets: 299 10*3/uL (ref 150–400)
RBC: 4.35 MIL/uL (ref 3.87–5.11)
RDW: 11.7 % (ref 11.5–15.5)
WBC: 9.8 10*3/uL (ref 4.0–10.5)
nRBC: 0 % (ref 0.0–0.2)

## 2019-05-17 LAB — SARS CORONAVIRUS 2 BY RT PCR (HOSPITAL ORDER, PERFORMED IN ~~LOC~~ HOSPITAL LAB): SARS Coronavirus 2: NEGATIVE

## 2019-05-17 LAB — SALICYLATE LEVEL: Salicylate Lvl: <7 mg/dL (ref 2.8–30.0)

## 2019-05-17 LAB — I-STAT BETA HCG BLOOD, ED (MC, WL, AP ONLY): I-stat hCG, quantitative: <5 m[IU]/mL (ref <5)

## 2019-05-17 LAB — ACETAMINOPHEN LEVEL: Acetaminophen (Tylenol), Serum: 11 ug/mL (ref 10–30)

## 2019-05-17 LAB — ETHANOL: Alcohol, Ethyl (B): <10 mg/dL (ref <10)

## 2019-05-17 MED ORDER — ZOLPIDEM TARTRATE 5 MG PO TABS: 5.0000 mg | ORAL_TABLET | Freq: Every evening | ORAL | Status: DC | PRN

## 2019-05-17 MED ORDER — STERILE WATER FOR INJECTION IJ SOLN
INTRAMUSCULAR | Status: AC
  Administered 2019-05-17: 20:00:00
  Filled 2019-05-17: qty 10

## 2019-05-17 MED ORDER — LITHIUM CARBONATE 300 MG PO CAPS
300.0000 mg | ORAL_CAPSULE | Freq: Two times a day (BID) | ORAL | Status: DC
  Administered 2019-05-17 – 2019-05-18 (×2): 300 mg via ORAL
  Filled 2019-05-17 (×2): qty 1

## 2019-05-17 MED ORDER — NICOTINE 21 MG/24HR TD PT24
21.0000 mg | MEDICATED_PATCH | Freq: Every day | TRANSDERMAL | Status: DC
  Filled 2019-05-17: qty 1

## 2019-05-17 MED ORDER — ALUM & MAG HYDROXIDE-SIMETH 200-200-20 MG/5ML PO SUSP: 30.0000 mL | Freq: Four times a day (QID) | ORAL | Status: DC | PRN

## 2019-05-17 MED ORDER — OLANZAPINE 10 MG IM SOLR
10.0000 mg | Freq: Once | INTRAMUSCULAR | Status: AC
  Administered 2019-05-17: 10 mg via INTRAMUSCULAR
  Filled 2019-05-17: qty 10

## 2019-05-17 MED ORDER — ACETAMINOPHEN 325 MG PO TABS: 650.0000 mg | ORAL_TABLET | ORAL | Status: DC | PRN

## 2019-05-17 MED ORDER — MIDAZOLAM HCL 2 MG/2ML IJ SOLN
4.0000 mg | Freq: Once | INTRAMUSCULAR | Status: AC
  Administered 2019-05-17: 4 mg via INTRAVENOUS
  Filled 2019-05-17: qty 4

## 2019-05-17 MED ORDER — ONDANSETRON HCL 4 MG PO TABS: 4.0000 mg | ORAL_TABLET | Freq: Three times a day (TID) | ORAL | Status: DC | PRN
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
## 2019-05-17 NOTE — ED Notes (Signed)
After receiving IM meds to help her gain control and calm down she asked to shower and needed assist from two techs to get dressed and escorted to bed as she has become tired and dizzy per her report. Assisted to bed and immediately was asleep. Sitter at her doorway for safety.

## 2019-05-17 NOTE — ED Notes (Signed)
Restless, difficulty staying in her room, states she needs to walk or hit her head against the wall till she dies. Flat affect. Offered food as a distraction. She agrees she is very hungry.

## 2019-05-17 NOTE — ED Notes (Signed)
Encourage patient to provide urine for uds.
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
## 2019-05-17 NOTE — B[facility] Counselor (Signed)
TTS attempted to assess patient via tele-psych. Edie, RN attempted to wake patient up for assessment. Patient continued to sleep heavily and is unable to participate at this time. TTS will attempt later on this date.

## 2019-05-17 NOTE — ED Notes (Signed)
Pt is uncooperative and refusing blood draw.  She is labile going from pleasant to crying and occasionally hollering out random names.
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
## 2019-05-17 NOTE — ED Notes (Signed)
Pt is very bizarre,  She walked straight into room and sat down and asked "How many times do I have to hit my head on this sink in order to die"

## 2019-05-17 NOTE — ED Notes (Signed)
Primary RN voiced concerns regarding patient's safety and flight risk.  This Probation officer spoke to [employer] and he has agreed to [employer] the patient.
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
## 2019-05-18 DIAGNOSIS — Z8659 Personal history of other mental and behavioral disorders: Secondary | ICD-10-CM

## 2019-05-18 NOTE — ED Notes (Addendum)
Patient refused vital signs at this time. Patient is now lying down quietly after being uncooperative for  most of this shift. Will try to retake vitals at a later time.

## 2019-05-18 NOTE — BHH Counselor (Signed)
Per Collie Siad, RN pt is currently sleeping. TTS to assess pt once alert, roused and able to engage.   Vertell Novak, [place], [facility], [facility] Triage Specialist [phone]
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
## 2019-05-18 NOTE — ED Notes (Signed)
Pt is agitated, hostile, blaming, demanding to know why we allowed a staff member to come into her room.  "I told you all that I do not want any men in my room."  Stands in her doorway making demands.

## 2019-05-18 NOTE — ED Provider Notes (Signed)
Patient still awaiting TTS. VSS.   Sherwood Gambler, MD 05/18/19 [phone]

## 2019-05-18 NOTE — ED Notes (Signed)
Pt discharged home. Discharged instructions read to pt who verbalized understanding. All belongings returned to pt. Denies SI/HI, is not delusional and not responding to internal stimuli. Escorted pt to the ED exit.  Cooperative at the time of discharge.

## 2019-05-18 NOTE — ED Notes (Signed)
Pt is seeking medication to make her sleep. She has been rude and demanding to staff requiring redirection which she responded to.

## 2019-05-18 NOTE — BH Assessment (Signed)
[facility] Assessment Progress Note  Per Carol Dresser, DO, this pt does not require psychiatric hospitalization at this time.  Pt presents under IVC initiated by EDP Gerlene Fee, MD which Dr Mariea Clonts has rescinded.  Pt is to be discharged from [facility] with recommendation to follow up with [facility] in [facility].  This has been included in pt's discharge instructions.  Pt's nurse, Diane, has been notified.  Jalene Mullet, [place] Triage Specialist [phone]

## 2019-05-18 NOTE — Discharge Instructions (Signed)
For your behavioral health needs, you are advised to follow up with [facility].  Contact them at your earliest opportunity to ask about scheduling an intake appointment:       [facility]      [address]      [place], [place] [address]      [phone]
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
## 2019-07-10 ENCOUNTER — Emergency Department
Admission: EM | Admit: 2019-07-10 | Discharge: 2019-07-10 | Disposition: A | Discharge status: Home / Self Care | Payer: Self-pay | Source: Home / Self Care

## 2019-07-10 ENCOUNTER — Other Ambulatory Visit: Payer: Self-pay

## 2019-07-10 DIAGNOSIS — S0502XA Injury of conjunctiva and corneal abrasion without foreign body, left eye, initial encounter: Secondary | ICD-10-CM

## 2019-07-10 MED ORDER — TOBRAMYCIN 0.3 % OP SOLN: 1.0000 [drp] | OPHTHALMIC | 0 refills | Status: AC

## 2019-07-10 MED ORDER — HYDROCODONE-ACETAMINOPHEN 5-325 MG PO TABS: 1.0000 | ORAL_TABLET | ORAL | 0 refills | Status: DC | PRN

## 2019-07-10 NOTE — Discharge Instructions (Signed)
Return if any problems.

## 2019-07-10 NOTE — ED Triage Notes (Signed)
Pt c/o LT eye pain since last night. Says her work badge caught her in the eye possibly causing a scratch. Pain 6/10.

## 2020-04-22 ENCOUNTER — Encounter (HOSPITAL_COMMUNITY): Payer: Self-pay | Admitting: Emergency Medicine

## 2020-04-22 ENCOUNTER — Other Ambulatory Visit: Payer: Self-pay

## 2020-04-22 DIAGNOSIS — Z5321 Procedure and treatment not carried out due to patient leaving prior to being seen by health care provider: Secondary | ICD-10-CM | POA: Insufficient documentation

## 2020-04-22 DIAGNOSIS — M25569 Pain in unspecified knee: Secondary | ICD-10-CM | POA: Insufficient documentation

## 2020-04-22 NOTE — ED Triage Notes (Signed)
Patient states that she walked 15 miles and is having muscle pain along with knee pain at this time.

## 2020-04-23 ENCOUNTER — Emergency Department (HOSPITAL_COMMUNITY)
Admission: EM | Admit: 2020-04-23 | Discharge: 2020-04-23 | Disposition: A | Discharge status: Home / Self Care | Payer: Self-pay | Attending: Emergency Medicine | Admitting: Emergency Medicine

## 2020-04-23 NOTE — ED Notes (Signed)
Patient called x 2 with no answer. Patient not in waiting room.

## 2020-04-23 NOTE — ED Notes (Signed)
Called x 1 with no answer. Patient not in the lobby.

## 2020-04-25 ENCOUNTER — Ambulatory Visit
Admission: EM | Admit: 2020-04-25 | Discharge: 2020-04-25 | Disposition: A | Discharge status: Home / Self Care | Payer: Self-pay

## 2020-04-25 ENCOUNTER — Encounter: Payer: Self-pay | Admitting: Emergency Medicine

## 2020-04-25 DIAGNOSIS — M25561 Pain in right knee: Secondary | ICD-10-CM

## 2020-04-25 MED ORDER — MELOXICAM 15 MG PO TABS: 15.0000 mg | ORAL_TABLET | Freq: Every day | ORAL | 0 refills | Status: DC

## 2020-04-25 NOTE — ED Provider Notes (Signed)
[facility]   [id number] 04/25/20 Arrival Time: 1233  CC: RT knee pain  SUBJECTIVE: History from: patient. Carol Miles is a 29 y.o. female complains of RT knee pain/ injury x 1 week.  Symptoms began after walking apx 15 miles.  Localizes the pain to the outside of RT knee.  Describes the pain as intermittent  Has tried OTC medications without relief.  Symptoms are made worse with jogging.  Denies similar symptoms in the past.  Denies fever, chills, erythema, ecchymosis, effusion, weakness, numbness and tingling.  ROS: As per HPI.  All other pertinent ROS negative.     Past Medical History:  Diagnosis Date  . Bipolar 1 disorder (HCC)   . Depression   . HPV (human papilloma virus) anogenital infection   . PTSD (post-traumatic stress disorder)    Past Surgical History:  Procedure Laterality Date  . HERNIA REPAIR     Allergies  Allergen Reactions  . Amoxicillin Hives  . Penicillins Hives    Childhood allergy-patient remembers taking Amoxicillin with no side effects. Did it involve swelling of the face/tongue/throat, SOB, or low BP? No Did it involve sudden or severe rash/hives, skin peeling, or any reaction on the inside of your mouth or nose? No Did you need to seek medical attention at a hospital or doctor's office? Unknown When did it last happen?childhood If all above answers are "NO", may proceed with cephalosporin use.   . Sulfa Antibiotics Nausea And Vomiting   No current facility-administered medications on file prior to encounter.   Current Outpatient Medications on File Prior to Encounter  Medication Sig Dispense Refill  . busPIRone (BUSPAR) 5 MG tablet Take 5 mg by mouth 2 (two) times daily.    . citalopram (CELEXA) 20 MG tablet Take 20 mg by mouth daily.    Marland Kitchen OLANZapine (ZYPREXA) 10 MG tablet Take 10 mg by mouth at bedtime.    . ziprasidone (GEODON) 20 MG capsule Take 20 mg by mouth 2 (two) times daily with a meal.    . [DISCONTINUED]  lurasidone (LATUDA) 20 MG TABS tablet Take by mouth.     Social History   Socioeconomic History  . Marital status: Single    Spouse name: Not on file  . Number of children: Not on file  . Years of education: Not on file  . Highest education level: Not on file  Occupational History  . Not on file  Tobacco Use  . Smoking status: Current Every Day Smoker    Packs/day: 1.00    Years: 6.00    Pack years: 6.00    Types: Cigarettes  . Smokeless tobacco: Never Used  Vaping Use  . Vaping Use: Never used  Substance and Sexual Activity  . Alcohol use: Yes    Comment: occ has a beer   . Drug use: Yes    Types: Marijuana  . Sexual activity: Not on file  Other Topics Concern  . Not on file  Social History Narrative  . Not on file   Social Determinants of Health   Financial Resource Strain:   . Difficulty of Paying Living Expenses:   Food Insecurity:   . Worried About Programme researcher, broadcasting/film/video in the Last Year:   . Barista in the Last Year:   Transportation Needs:   . Freight forwarder (Medical):   Marland Kitchen Lack of Transportation (Non-Medical):   Physical Activity:   . Days of Exercise per Week:   . Minutes of  Exercise per Session:   Stress:   . Feeling of Stress :   Social Connections:   . Frequency of Communication with Friends and Family:   . Frequency of Social Gatherings with Friends and Family:   . Attends Religious Services:   . Active Member of Clubs or Organizations:   . Attends Banker Meetings:   Marland Kitchen Marital Status:   Intimate Partner Violence:   . Fear of Current or Ex-Partner:   . Emotionally Abused:   Marland Kitchen Physically Abused:   . Sexually Abused:    Family History  Problem Relation Age of Onset  . OCD Brother     OBJECTIVE:  Vitals:   04/25/20 1243 04/25/20 1247  BP:  105/60  Pulse:  94  Resp:  18  Temp:  99 F (37.2 C)  TempSrc:  Oral  SpO2:  96%  Weight: 145 lb (65.8 kg)   Height: 5\' 7"  (1.702 m)     General appearance: ALERT; in  no acute distress.  Head: NCAT Lungs: Normal respiratory effort CV: Posterior tibialis pulse 2+ Musculoskeletal: RT knee Inspection: Skin warm, dry, clear and intact without obvious erythema, effusion, or ecchymosis.  Palpation: mildly TTP over lateral joint line ROM: FROM active and passive Strength: 5/5 knee flexion, 5/5 knee extension Skin: warm and dry Neurologic: Ambulates without difficulty; Sensation intact about the lower extremities Psychological: alert and cooperative; normal mood and affect  ASSESSMENT & PLAN:  1. Acute pain of right knee   2. Lateral joint line tenderness of knee, right     Meds ordered this encounter  Medications  . meloxicam (MOBIC) 15 MG tablet    Sig: Take 1 tablet (15 mg total) by mouth daily.    Dispense:  30 tablet    Refill:  0    Order Specific Question:   Supervising Provider    Answer:   Eustace Moore    Continue conservative management of rest, ice, elevation, and gentle stretches Knee brace given Take mobic as needed for pain relief (may cause abdominal discomfort, ulcers, and GI bleeds avoid taking with other NSAIDs) Follow up with knee specialist if symptoms persists Return or go to the ER if you have any new or worsening symptoms (fever, chills, chest pain, redness, swelling, deformity, worsening symptoms despite treatment, etc...)    Reviewed expectations re: course of current medical issues. Questions answered. Outlined signs and symptoms indicating need for more acute intervention. Patient verbalized understanding. After Visit Summary given.    [[id number]], PA-C 04/25/20 1311

## 2020-04-25 NOTE — Discharge Instructions (Signed)
Continue conservative management of rest, ice, elevation, and gentle stretches Knee brace given Take mobic as needed for pain relief (may cause abdominal discomfort, ulcers, and GI bleeds avoid taking with other NSAIDs).  Avoid taking within 4 hours of taking celexa/ citalopram Follow up with knee specialist if symptoms persists Return or go to the ER if you have any new or worsening symptoms (fever, chills, chest pain, redness, swelling, deformity, worsening symptoms despite treatment, etc...)

## 2020-04-25 NOTE — ED Triage Notes (Signed)
Pain to RT knee.  Denies any injury, states she has walked about 15 days.  Pt also reports sinus pressure and cough.

## 2020-05-06 ENCOUNTER — Other Ambulatory Visit: Payer: Self-pay

## 2020-05-06 ENCOUNTER — Encounter (HOSPITAL_COMMUNITY): Payer: Self-pay | Admitting: Emergency Medicine

## 2020-05-06 ENCOUNTER — Emergency Department (HOSPITAL_COMMUNITY)
Admission: EM | Admit: 2020-05-06 | Discharge: 2020-05-09 | Disposition: A | Discharge status: Home / Self Care | Payer: Self-pay | Attending: Emergency Medicine | Admitting: Emergency Medicine

## 2020-05-06 DIAGNOSIS — F17210 Nicotine dependence, cigarettes, uncomplicated: Secondary | ICD-10-CM | POA: Insufficient documentation

## 2020-05-06 DIAGNOSIS — Z20822 Contact with and (suspected) exposure to covid-19: Secondary | ICD-10-CM | POA: Insufficient documentation

## 2020-05-06 DIAGNOSIS — F1510 Other stimulant abuse, uncomplicated: Secondary | ICD-10-CM | POA: Insufficient documentation

## 2020-05-06 DIAGNOSIS — F1210 Cannabis abuse, uncomplicated: Secondary | ICD-10-CM | POA: Insufficient documentation

## 2020-05-06 DIAGNOSIS — F3164 Bipolar disorder, current episode mixed, severe, with psychotic features: Secondary | ICD-10-CM | POA: Insufficient documentation

## 2020-05-06 DIAGNOSIS — R45851 Suicidal ideations: Secondary | ICD-10-CM | POA: Insufficient documentation

## 2020-05-06 LAB — COMPREHENSIVE METABOLIC PANEL
ALT: 15 U/L (ref 0–44)
AST: 16 U/L (ref 15–41)
Albumin: 4.4 g/dL (ref 3.5–5.0)
Alkaline Phosphatase: 58 U/L (ref 38–126)
Anion gap: 9 (ref 5–15)
BUN: 15 mg/dL (ref 6–20)
CO2: 25 mmol/L (ref 22–32)
Calcium: 9 mg/dL (ref 8.9–10.3)
Chloride: 103 mmol/L (ref 98–111)
Creatinine, Ser: 0.9 mg/dL (ref 0.44–1.00)
GFR calc Af Amer: >60 mL/min (ref >60)
GFR calc non Af Amer: >60 mL/min (ref >60)
Glucose, Bld: 109 mg/dL — ABNORMAL HIGH (ref 70–99)
Potassium: 4 mmol/L (ref 3.5–5.1)
Sodium: 137 mmol/L (ref 135–145)
Total Bilirubin: 0.8 mg/dL (ref 0.3–1.2)
Total Protein: 7.3 g/dL (ref 6.5–8.1)

## 2020-05-06 LAB — RAPID URINE DRUG SCREEN, HOSP PERFORMED
Amphetamines: POSITIVE — AB
Barbiturates: NOT DETECTED
Benzodiazepines: NOT DETECTED
Cocaine: NOT DETECTED
Opiates: NOT DETECTED
Tetrahydrocannabinol: POSITIVE — AB

## 2020-05-06 LAB — URINALYSIS, ROUTINE W REFLEX MICROSCOPIC
Bacteria, UA: NONE SEEN
Bilirubin Urine: NEGATIVE
Glucose, UA: NEGATIVE mg/dL
Hgb urine dipstick: NEGATIVE
Ketones, ur: NEGATIVE mg/dL
Leukocytes,Ua: NEGATIVE
Nitrite: NEGATIVE
Protein, ur: >=300 mg/dL — AB
Specific Gravity, Urine: 1.025 (ref 1.005–1.030)
pH: 5 (ref 5.0–8.0)

## 2020-05-06 LAB — CBC WITH DIFFERENTIAL/PLATELET
Abs Immature Granulocytes: 0.03 10*3/uL (ref 0.00–0.07)
Basophils Absolute: 0 10*3/uL (ref 0.0–0.1)
Basophils Relative: 0 %
Eosinophils Absolute: 0.2 10*3/uL (ref 0.0–0.5)
Eosinophils Relative: 2 %
HCT: 41.6 % (ref 36.0–46.0)
Hemoglobin: 14.3 g/dL (ref 12.0–15.0)
Immature Granulocytes: 0 %
Lymphocytes Relative: 26 %
Lymphs Abs: 2.6 10*3/uL (ref 0.7–4.0)
MCH: 32.1 pg (ref 26.0–34.0)
MCHC: 34.4 g/dL (ref 30.0–36.0)
MCV: 93.5 fL (ref 80.0–100.0)
Monocytes Absolute: 0.7 10*3/uL (ref 0.1–1.0)
Monocytes Relative: 7 %
Neutro Abs: 6.6 10*3/uL (ref 1.7–7.7)
Neutrophils Relative %: 65 %
Platelets: 329 10*3/uL (ref 150–400)
RBC: 4.45 MIL/uL (ref 3.87–5.11)
RDW: 12.2 % (ref 11.5–15.5)
WBC: 10.1 10*3/uL (ref 4.0–10.5)
nRBC: 0 % (ref 0.0–0.2)

## 2020-05-06 LAB — SALICYLATE LEVEL: Salicylate Lvl: <7 mg/dL — ABNORMAL LOW (ref 7.0–30.0)

## 2020-05-06 LAB — ACETAMINOPHEN LEVEL: Acetaminophen (Tylenol), Serum: <10 ug/mL — ABNORMAL LOW (ref 10–30)

## 2020-05-06 LAB — PREGNANCY, URINE: Preg Test, Ur: NEGATIVE — AB

## 2020-05-06 LAB — ETHANOL: Alcohol, Ethyl (B): <10 mg/dL (ref <10)

## 2020-05-06 MED ORDER — CITALOPRAM HYDROBROMIDE 20 MG PO TABS
20.0000 mg | ORAL_TABLET | Freq: Every day | ORAL | Status: DC
  Administered 2020-05-07 – 2020-05-08 (×2): 20 mg via ORAL
  Filled 2020-05-06 (×4): qty 1

## 2020-05-06 MED ORDER — NICOTINE 21 MG/24HR TD PT24
21.0000 mg | MEDICATED_PATCH | Freq: Once | TRANSDERMAL | Status: AC
  Administered 2020-05-06: 21 mg via TRANSDERMAL
  Filled 2020-05-06: qty 1

## 2020-05-06 MED ORDER — STERILE WATER FOR INJECTION IJ SOLN
INTRAMUSCULAR | Status: AC
  Filled 2020-05-06: qty 10

## 2020-05-06 MED ORDER — ZIPRASIDONE HCL 20 MG PO CAPS
20.0000 mg | ORAL_CAPSULE | Freq: Two times a day (BID) | ORAL | Status: DC
  Administered 2020-05-06 – 2020-05-08 (×5): 20 mg via ORAL
  Filled 2020-05-06 (×7): qty 1

## 2020-05-06 MED ORDER — BUSPIRONE HCL 5 MG PO TABS
5.0000 mg | ORAL_TABLET | Freq: Two times a day (BID) | ORAL | Status: DC
  Administered 2020-05-06 – 2020-05-09 (×7): 5 mg via ORAL
  Filled 2020-05-06 (×8): qty 1

## 2020-05-06 MED ORDER — ZIPRASIDONE MESYLATE 20 MG IM SOLR
10.0000 mg | Freq: Once | INTRAMUSCULAR | Status: AC
  Administered 2020-05-06: 10 mg via INTRAMUSCULAR
  Filled 2020-05-06: qty 20

## 2020-05-06 NOTE — ED Notes (Signed)
Pt's boyfriend brought a bag of belonging they were added to existing belongings in [facility]

## 2020-05-06 NOTE — ED Notes (Signed)
Pt states that she did not refuse her medications earlier and that staff is lying to her. Pt screaming and attempting to leave hospital stating she is not suicidal and [facility] staff are not helping her.   Estell Harpin, MD notified of pt behavior and pt is now IVC'd.

## 2020-05-06 NOTE — ED Notes (Signed)
Pt asking for nicotine patch, edp notified and orders received.

## 2020-05-06 NOTE — ED Triage Notes (Signed)
Pt arrives via [facility] w/complaints of panic attacks. Pt has long history of mental illness & is requesting help. Pt is suicidal, denies HI, tearful in triage.

## 2020-05-06 NOTE — ED Notes (Signed)
Pt belongings including, clothing and shoes locked in locker. Large bag of miscellaneous items locked in locker as well.
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
## 2020-05-06 NOTE — ED Notes (Signed)
Pt yelling at staff at patient's door. Pt angry that Maurine Minister, security, is observing pt. Pt states "Can that man get the fuck away from me?" referring to [facility].   Pt advised to stay in room. [employer] notified to observe pt during pt outbreak for staff safety.

## 2020-05-06 NOTE — ED Notes (Signed)
PT requesting Nicotine gum. Offered pt Nicotine patch and pt declined stating they do not work.

## 2020-05-06 NOTE — ED Notes (Signed)
Pt refused medications.

## 2020-05-06 NOTE — ED Notes (Signed)
Pt states she would put a gun to her head if she had access to one

## 2020-05-06 NOTE — ED Notes (Signed)
Pt slowly pacing room. Pt exhibiting calm behavior since Geodon.

## 2020-05-06 NOTE — ED Notes (Signed)
Staff made several attempts to get a breakfast meal tray for the pt. Nutritional services stated that the pt wouldn't get a meal tray until lunch. Pt is irritated because she wants food. This NT tried to find breakfast food for the pt but was unsuccessful. RN and [facility] notified.

## 2020-05-06 NOTE — ED Notes (Signed)
Pt wanded by security, going to take shower due to body odor

## 2020-05-06 NOTE — ED Notes (Signed)
IVC paperwork faxed to Magistrate Office °

## 2020-05-07 MED ORDER — NICOTINE 21 MG/24HR TD PT24
21.0000 mg | MEDICATED_PATCH | Freq: Once | TRANSDERMAL | Status: DC
  Filled 2020-05-07: qty 1

## 2020-05-07 NOTE — ED Notes (Addendum)
Patient coming out of the room stating "you can not send me to a long term facility, if I go there im going to kill myself". Becoming very agitated. Asking for the phone making phone calls to family, with a loud expression. Came out to the hall way but was redirectable to the room.  Asked to speak to a doctor, asked to talk to Engineer, materials. Security officer (dennis) went to talk with the patient.

## 2020-05-07 NOTE — ED Notes (Signed)
Patients boyfriend is at bedside.
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
## 2020-05-07 NOTE — B[facility] Counselor (Signed)
Pt is a 29 year old female who remains at [facility] due to manic behavior.  Pt was in manic, agitated, and belligerent state when assessed on 05/06/20, and she was determined to meet inpatient criteria.  Pt was reassessed this AM.  When asked how she felt, Pt stated, ''I feel these things called emotions.  They change all the time.''  Pt stated that she mostly feels ''scared, worried, and a little suicidal.''  Pt stated that if she were not at the hospital, she would go outside, smoke a cigarette, and sleep in her car.  Pt indicated that she receives outpatient services with Nena Alexander, NP with [facility].  She stated that she missed a dose of medication, but she received one this AM.  Recommend continued inpatient.

## 2020-05-07 NOTE — ED Notes (Signed)
Patient was given lunch tray. Patient is resting in bed.

## 2020-05-07 NOTE — ED Notes (Signed)
Patient is asleep at this time. Equal chest fall and rise.

## 2020-05-07 NOTE — ED Notes (Signed)
Pt comes up to desk asking for a nicotine patch, states she took it off earlier because it irritates her skin.  Went in to give pt the patch and pt states she did not want the nurse to come near her.  Nicotine patch not applied. Pt did take her geodon po

## 2020-05-07 NOTE — ED Notes (Signed)
Pt sleeping at this time. Equal rise and fall of chest.

## 2020-05-07 NOTE — BH Assessment (Signed)
7/21: Patient meets criteria for inpatient treatment. Pending placement at the following facilities:  [facility] (declined, per Nanine Means, DNP due to pt. acuity) [facility]      [facility]     [facility]    [facility]    [facility]    [facility]    [facility]    [facility]    [facility]

## 2020-05-08 LAB — SARS CORONAVIRUS 2 BY RT PCR (HOSPITAL ORDER, PERFORMED IN ~~LOC~~ HOSPITAL LAB): SARS Coronavirus 2: NEGATIVE

## 2020-05-08 NOTE — ED Notes (Addendum)
Pt asked tech if boyfriend could be called for her to see if he could visit. This tech clarified protocol with nurse that pt is not have visitors.  Informed patient that she could not have any visitors at this time. Pt began cursing at tech and yelling that she is a prisoner. RN Notified.

## 2020-05-08 NOTE — BH Assessment (Signed)
Reassessment 7/22:   Patient re-assessed on this day. She was more cooperative than she has been upon arrival. However, continues to remain main, angry, and easily irritable. Patient states several times, "When can I go f%^&ing home". She indicates that she doesn't need to be in the hospital. She says that she called 911 not for herself but her boyfriend. States that she was afraid for her boyfriends life because he is "manic, sleep deprived, and behind the wheel of a car". Patient goes on to explain that she is very uncomfortable in the hospital and hasn't been able to sleep. She feels that if she is discharge home she will be able to sleep better. When asked about her support system she becomes angry stating, "None of your f*%^ing business". When asked about her outpatient services she states, "None of your f%^&ing business".  Patient was reluctant to answer questions toward the end of the conversation.   Prior to asking her she blurted out, "I am not suicidal, I am not homicidal, I am not psychotic".   Patient's mood is very labile and patient is currently very impulsive.  Therefore, inpatient treatment continues to be recommended.

## 2020-05-08 NOTE — ED Notes (Signed)
Meal provided 

## 2020-05-08 NOTE — ED Notes (Signed)
Pt ambulated to the BR with a steady gait.

## 2020-05-08 NOTE — ED Notes (Signed)
Pt informed need COVID swab; pt refused

## 2020-05-08 NOTE — ED Notes (Signed)
Pt provided with shower supplies. Currently in bathroom.

## 2020-05-08 NOTE — BH Assessment (Signed)
Behavioral Health Note:  Patient was seen for re-assessment.  Patient continues to be manic, her speech pressured and she is very irritable.  Patient states that she does not need help for her suicidal thoughts.  She states that if [facility] sends her to a psychiatric facility that, "I am going to sure the every-living Laroy Apple out of you, I don't appreciate you and [facility] ruining my credit for a bill I cannot pay."  Patient's mood is very labile and patient is currently very impulsive.  Therefore, inpatient treatment continues to be recommended.

## 2020-05-08 NOTE — ED Notes (Signed)
Pt back to room and meal tray given

## 2020-05-08 NOTE — ED Notes (Signed)
Pt ambulated to BR with steady gait; pt yelling she "has fucking rights and the god damn door needs to be closed"; security and [employer] on unit; pt back in room

## 2020-05-08 NOTE — Progress Notes (Signed)
Pt continues to meet inpatient criteria. Referral information has resent to the following hospitals for review:  [facility]  [facility]  [facility]  [facility]  [facility]  [facility]  [facility]  [facility]  [facility]  [facility]  [facility]  [facility]  [facility]   Disposition will continue to follow.    Wells Guiles, LCSW, LCAS Disposition CSW [facility]/TTS [phone]

## 2020-05-08 NOTE — ED Notes (Signed)
TTS in the room for evaluation.

## 2020-05-08 NOTE — ED Notes (Signed)
Pt asked to use ph to make call, pt attempted to make a call to family with no answer, pt called 911 from portable ph

## 2020-05-09 ENCOUNTER — Other Ambulatory Visit: Payer: Self-pay

## 2020-05-09 ENCOUNTER — Inpatient Hospital Stay (HOSPITAL_COMMUNITY)
Admission: AD | Admit: 2020-05-09 | Discharge: 2020-05-13 | DRG: 885 | Disposition: A | Discharge status: Home / Self Care | Payer: Federal, State, Local not specified - Other | Source: Intra-hospital | Attending: Psychiatry | Admitting: Psychiatry

## 2020-05-09 ENCOUNTER — Encounter (HOSPITAL_COMMUNITY): Payer: Self-pay | Admitting: Psychiatry

## 2020-05-09 DIAGNOSIS — F1210 Cannabis abuse, uncomplicated: Secondary | ICD-10-CM | POA: Diagnosis present

## 2020-05-09 DIAGNOSIS — Z91410 Personal history of adult physical and sexual abuse: Secondary | ICD-10-CM | POA: Diagnosis not present

## 2020-05-09 DIAGNOSIS — Z590 Homelessness: Secondary | ICD-10-CM

## 2020-05-09 DIAGNOSIS — J45909 Unspecified asthma, uncomplicated: Secondary | ICD-10-CM | POA: Diagnosis present

## 2020-05-09 DIAGNOSIS — F4310 Post-traumatic stress disorder, unspecified: Secondary | ICD-10-CM | POA: Diagnosis present

## 2020-05-09 DIAGNOSIS — Z79899 Other long term (current) drug therapy: Secondary | ICD-10-CM

## 2020-05-09 DIAGNOSIS — F1510 Other stimulant abuse, uncomplicated: Secondary | ICD-10-CM | POA: Diagnosis present

## 2020-05-09 DIAGNOSIS — F319 Bipolar disorder, unspecified: Secondary | ICD-10-CM | POA: Diagnosis present

## 2020-05-09 DIAGNOSIS — R45851 Suicidal ideations: Secondary | ICD-10-CM | POA: Diagnosis present

## 2020-05-09 DIAGNOSIS — R45850 Homicidal ideations: Secondary | ICD-10-CM | POA: Diagnosis present

## 2020-05-09 DIAGNOSIS — F312 Bipolar disorder, current episode manic severe with psychotic features: Secondary | ICD-10-CM | POA: Diagnosis not present

## 2020-05-09 DIAGNOSIS — Z915 Personal history of self-harm: Secondary | ICD-10-CM | POA: Diagnosis not present

## 2020-05-09 DIAGNOSIS — Z20822 Contact with and (suspected) exposure to covid-19: Secondary | ICD-10-CM | POA: Diagnosis present

## 2020-05-09 DIAGNOSIS — Z818 Family history of other mental and behavioral disorders: Secondary | ICD-10-CM | POA: Diagnosis not present

## 2020-05-09 DIAGNOSIS — F17210 Nicotine dependence, cigarettes, uncomplicated: Secondary | ICD-10-CM | POA: Diagnosis present

## 2020-05-09 DIAGNOSIS — Z880 Allergy status to penicillin: Secondary | ICD-10-CM

## 2020-05-09 MED ORDER — TRAZODONE HCL 50 MG PO TABS: 50.0000 mg | ORAL_TABLET | Freq: Every evening | ORAL | Status: DC | PRN

## 2020-05-09 MED ORDER — ZIPRASIDONE HCL 20 MG PO CAPS
20.0000 mg | ORAL_CAPSULE | Freq: Every day | ORAL | Status: DC
  Administered 2020-05-09: 20 mg via ORAL
  Filled 2020-05-09 (×4): qty 1

## 2020-05-09 MED ORDER — ZIPRASIDONE HCL 20 MG PO CAPS: 20.0000 mg | ORAL_CAPSULE | Freq: Every day | ORAL | Status: DC

## 2020-05-09 MED ORDER — ACETAMINOPHEN 325 MG PO TABS: 650.0000 mg | ORAL_TABLET | Freq: Four times a day (QID) | ORAL | Status: DC | PRN

## 2020-05-09 MED ORDER — HYDROXYZINE HCL 25 MG PO TABS
25.0000 mg | ORAL_TABLET | Freq: Three times a day (TID) | ORAL | Status: DC | PRN
  Administered 2020-05-09 – 2020-05-11 (×2): 25 mg via ORAL
  Filled 2020-05-09: qty 10
  Filled 2020-05-09 (×3): qty 1

## 2020-05-09 MED ORDER — MAGNESIUM HYDROXIDE 400 MG/5ML PO SUSP: 30.0000 mL | Freq: Every day | ORAL | Status: DC | PRN

## 2020-05-09 MED ORDER — TRAZODONE HCL 50 MG PO TABS
50.0000 mg | ORAL_TABLET | Freq: Every evening | ORAL | Status: DC | PRN
  Administered 2020-05-09: 50 mg via ORAL
  Filled 2020-05-09 (×2): qty 1

## 2020-05-09 MED ORDER — BUSPIRONE HCL 5 MG PO TABS
5.0000 mg | ORAL_TABLET | Freq: Two times a day (BID) | ORAL | Status: DC
  Administered 2020-05-09 – 2020-05-10 (×2): 5 mg via ORAL
  Filled 2020-05-09 (×7): qty 1

## 2020-05-09 MED ORDER — ALUM & MAG HYDROXIDE-SIMETH 200-200-20 MG/5ML PO SUSP: 30.0000 mL | ORAL | Status: DC | PRN

## 2020-05-09 MED ORDER — CITALOPRAM HYDROBROMIDE 20 MG PO TABS
20.0000 mg | ORAL_TABLET | Freq: Every day | ORAL | Status: DC
  Administered 2020-05-09 – 2020-05-10 (×2): 20 mg via ORAL
  Filled 2020-05-09 (×5): qty 1

## 2020-05-09 NOTE — Progress Notes (Signed)
   05/09/20 2130  COVID-19 Daily Checkoff  Have you had a fever (temp > 37.80C/100F)  in the past 24 hours?  No  If you have had runny nose, nasal congestion, sneezing in the past 24 hours, has it worsened? No  COVID-19 EXPOSURE  Have you traveled outside the state in the past 14 days? No  Have you been in contact with someone with a confirmed diagnosis of COVID-19 or PUI in the past 14 days without wearing appropriate PPE? No  Have you been living in the same home as a person with confirmed diagnosis of COVID-19 or a PUI (household contact)? No  Have you been diagnosed with COVID-19? No   

## 2020-05-09 NOTE — Progress Notes (Signed)
Patient ID: Carol Miles, female   DOB: 1991/03/21, 29 y.o.   MRN: [id number]  Per Ed notes of 05/06/20: Patient presents via EMS.  She states she has been living out of a car for many weeks.  History is hard to obtain because patient jumps from one subject to another and her speech is hard to understand because she starts sobbing and crying.  She states because a Covid she lost her job.  She states she had an apartment and "my parent sabotaged me" but she will not say what that means.  She states she has "sleep deprivation".  She states "I am scared".  She states she saw a large white object that is bigger than a human at her boyfriend's parents house in the driveway and she states the cat that was outside with her also saw and wanted back in the house.  She states they are not living with her boyfriend's parents.  05-09-20: Received a call from the patient's RN indicating that this patient may need medication dose adjustment. Apparent patient was currently receiving Geodon 20 mg bid for mood control. However, reports indicated that patient has asked for her Geodon 20 to be switched back to bedtime as that was her usual dosing time. The RN states that a phone call to the patient's pharmacy did confirm that patient was taking this medication once daily at bed time. It was agreed based on this information to switch patient back to taking Geodon 20 mg once at bedtime with food. The RN also states that patient had requested to get back on Zyprexa that she was taking at night time for sleep. The RN also indicated that a review of her medications had shown that she has not been on Zyprexa in over one year. The request by patient to be put back on Zyprexa is declined at this time as there are no clinical indications to keep her on two separate antipsychotic therapy at this time. However, she was ordered Trazodone 50 mg po Q hs prn to aid her sleep better if she is willing to take this medication. Marland Kitchen

## 2020-05-09 NOTE — Progress Notes (Signed)
Patient has been up in the dayroom interacting with other patients in the dayroom. She has been very anxious and had to be redirected when in the dayroom doing jumping jacks in front of 2 female patients. She reports that she is very glad to be here. She was compliant with her medication and requested medication to help her rest tonight. Support given and safety maintained with 15 min checks.

## 2020-05-09 NOTE — ED Notes (Signed)
Pt is getting upset yelling "we are trying to mess with her that they have increased her medications on her and that is not right." Pt states "I am fighting a war in my head and I feel like you guys are using me as an experiment." Pt asking about her Zyprexa as well. Meagan RN notified.

## 2020-05-09 NOTE — ED Notes (Signed)
Pt refuses to take her Geodon. Pt states, "that's too damn much". Pt reports she only takes Geodon at night prior to arrival because it makes her "skin crawl". RN will talk to pharmacy tech about reviewing home meds with her pharmacy to make sure what we have on file for her prior to arrival medications is correct.

## 2020-05-09 NOTE — Tx Team (Signed)
Initial Treatment Plan 05/09/2020 3:41 PM Carol Miles JQD:[id number]    PATIENT STRESSORS: Financial difficulties Occupational concerns Substance abuse   PATIENT STRENGTHS: Motivation for treatment/growth Supportive family/friends   PATIENT IDENTIFIED PROBLEMS: Paranoia  Anxiety  Substance Abuse                 DISCHARGE CRITERIA:  Ability to meet basic life and health needs Motivation to continue treatment in a less acute level of care Safe-care adequate arrangements made  PRELIMINARY DISCHARGE PLAN: Attend aftercare/continuing care group Outpatient therapy Placement in alternative living arrangements  PATIENT/FAMILY INVOLVEMENT: This treatment plan has been presented to and reviewed with the patient, Carol Miles, and/or family member.  The patient and family have been given the opportunity to ask questions and make suggestions.  Clarene Critchley, RN 05/09/2020, 3:41 PM

## 2020-05-09 NOTE — Progress Notes (Signed)
Admission Note: Patient is a 29 year old female admitted to the unit from [facility] for substance abuse, paranoia and for medication adjustment.  Patient is a poor historian.  Presents with some thought-blocking and tangentiality.  States she is here for safety and the safety of her boyfriend.  Admission plan of care reviewed and consent signed.  Skin assessment and personal belongings completed.  Skin is dry and intact.  No contraband found.  Patient oriented to the unit, staff and room.  Verbalizes understanding of unit rules and protocols.  Routine safety checks initiated.  Patient is safe on the unit.  Patient visible in milieu interacting with peers and watching TV.

## 2020-05-09 NOTE — ED Notes (Signed)
Pt ambulatory to the shower. Pt's bed linen changed pt given new scrubs, socks, and mesh panties.

## 2020-05-09 NOTE — Progress Notes (Signed)
Notified ED charge nurse at [facility] that Carol Miles has been assigned room 507-2.  Accepting provider is Dr. Lucianne Muss.  [facility] can accept the patient ASAP.  Please call report to [phone].

## 2020-05-09 NOTE — ED Notes (Signed)
RN went in to do hourly rounding and asked pt if she needed anything. Pt denied any needs and upon RN walking out of the room the pt mumbled. RN asked pt to repeat because she couldn't hear due to the noise in the hallway and pt stated, "I'm just trying to pick a fight". RN spoke with pt about how this would not be appropriate behavior. RN walked away again and then several minutes later she started shouting "This is reverse racism. I don't know why I'm being treated like this". Pt appeared to be trying

## 2020-05-10 DIAGNOSIS — F312 Bipolar disorder, current episode manic severe with psychotic features: Secondary | ICD-10-CM

## 2020-05-10 LAB — LIPID PANEL
Cholesterol: 149 mg/dL (ref 0–200)
HDL: 47 mg/dL (ref >40)
LDL Cholesterol: 86 mg/dL (ref 0–99)
Total CHOL/HDL Ratio: 3.2 RATIO
Triglycerides: 78 mg/dL (ref <150)
VLDL: 16 mg/dL (ref 0–40)

## 2020-05-10 LAB — TSH: TSH: 0.406 u[IU]/mL (ref 0.350–4.500)

## 2020-05-10 MED ORDER — OLANZAPINE 5 MG PO TABS
5.0000 mg | ORAL_TABLET | Freq: Every day | ORAL | Status: DC
  Administered 2020-05-10 – 2020-05-12 (×3): 5 mg via ORAL
  Filled 2020-05-10 (×2): qty 1
  Filled 2020-05-10: qty 7
  Filled 2020-05-10: qty 1
  Filled 2020-05-10: qty 7
  Filled 2020-05-10: qty 1
  Filled 2020-05-10: qty 2

## 2020-05-10 MED ORDER — ZIPRASIDONE MESYLATE 20 MG IM SOLR: 10.0000 mg | INTRAMUSCULAR | Status: DC | PRN

## 2020-05-10 MED ORDER — LORAZEPAM 1 MG PO TABS: 1.0000 mg | ORAL_TABLET | ORAL | Status: DC | PRN

## 2020-05-10 MED ORDER — OLANZAPINE 5 MG PO TBDP
5.0000 mg | ORAL_TABLET | Freq: Three times a day (TID) | ORAL | Status: DC | PRN
  Administered 2020-05-11 – 2020-05-12 (×2): 5 mg via ORAL
  Filled 2020-05-10 (×2): qty 1

## 2020-05-10 NOTE — Progress Notes (Signed)
   05/10/20 1100  Psych Admission Type (Psych Patients Only)  Admission Status Voluntary  Psychosocial Assessment  Patient Complaints Anxiety  Eye Contact Intense  Facial Expression Animated  Affect Anxious  Speech Soft  Interaction Assertive  Motor Activity Fidgety  Appearance/Hygiene In scrubs  Behavior Characteristics Cooperative;Appropriate to situation  Mood Anxious;Pleasant  Thought Process  Coherency WDL  Content Preoccupation;Paranoia  Delusions None reported or observed  Perception Derealization  Hallucination None reported or observed  Judgment Impaired  Confusion None  Danger to Self  Current suicidal ideation? Denies  Danger to Others  Danger to Others None reported or observed

## 2020-05-10 NOTE — [facility] Group Notes (Signed)
Adult Psychoeducational Group Note  Date:  05/10/2020 Time:  12:55 PM  Group Topic/Focus:  Goals Group:   The focus of this group is to help patients establish daily goals to achieve during treatment and discuss how the patient can incorporate goal setting into their daily lives to aide in recovery.  Participation Level:  Did Not Attend   Dione Housekeeper 05/10/2020, 12:55 PM
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
## 2020-05-10 NOTE — Progress Notes (Signed)
   05/10/20 2100  Psych Admission Type (Psych Patients Only)  Admission Status Voluntary  Psychosocial Assessment  Patient Complaints Anxiety  Eye Contact Fair  Facial Expression Animated  Affect Anxious  Speech Soft  Interaction Assertive  Motor Activity Fidgety  Appearance/Hygiene In scrubs  Behavior Characteristics Cooperative  Mood Anxious;Pleasant  Thought Process  Coherency WDL  Content Preoccupation;Paranoia  Delusions None reported or observed  Perception Derealization  Hallucination None reported or observed  Judgment Impaired  Confusion None  Danger to Self  Current suicidal ideation? Denies  Danger to Others  Danger to Others None reported or observed
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
## 2020-05-10 NOTE — Progress Notes (Signed)
   05/10/20 2222  COVID-19 Daily Checkoff  Have you had a fever (temp > 37.80C/100F)  in the past 24 hours?  No  If you have had runny nose, nasal congestion, sneezing in the past 24 hours, has it worsened? No  COVID-19 EXPOSURE  Have you traveled outside the state in the past 14 days? No  Have you been in contact with someone with a confirmed diagnosis of COVID-19 or PUI in the past 14 days without wearing appropriate PPE? No  Have you been living in the same home as a person with confirmed diagnosis of COVID-19 or a PUI (household contact)? No  Have you been diagnosed with COVID-19? No

## 2020-05-10 NOTE — [facility] Group Notes (Signed)
  [facility] LCSW Group Therapy Note  Date/Time:  05/10/2020 11:15AM-12:00PM  Type of Therapy and Topic:  Group Therapy:  Self-Care after Hospital Discharge  Participation Level:  Active   Description of Group This process group involved patients discussing how they plan to take care of themselves in a better manner when they get home from the hospital.  The group started with patients listing one healthy and one unhealthy way they took care of themselves prior to hospitalization.  A discussion ensued about the differences in healthy and unhealthy coping skills.  Group members shared ideas about making changes when they return home so that they can stay well and in recovery.  The white board was used to list ideas so that patients can continue to see these ideas throughout the day.  Therapeutic Goals Patient will identify and describe one healthy and one unhealthy coping technique used prior to hospitalization Patient will participate in generating ideas about healthy self-care options when they return to the community Patients will be supportive of one another and receive said support from others Patient will identify one healthy self-care activity to add to his/her post-hospitalization life that can help in recovery  Summary of Patient Progress:  The patient expressed that prior to hospitalization on healthy self-care activity she engaged in was taking her medicines, while one unhealthy self-care activity was believing her own negative thoughts.  Patient's participation in group was excellent and insightful.   Patient identified finding a support group as another self-care activity to add in her pursuit of recovery post-discharge.   Therapeutic Modalities Brief Solution-Focused Therapy Motivational Interviewing Psychoeducation   Ambrose Mantle, LCSW 05/10/2020, 12:00pm

## 2020-05-10 NOTE — Progress Notes (Signed)
EKG results placed on the outside of shadow chart  Normal sinus rhythm with sinus arrhythmia Normal ECG QT/QTc  368/414 ms

## 2020-05-11 DIAGNOSIS — F312 Bipolar disorder, current episode manic severe with psychotic features: Secondary | ICD-10-CM | POA: Diagnosis not present

## 2020-05-11 LAB — HEMOGLOBIN A1C
Hgb A1c MFr Bld: 4.9 % (ref 4.8–5.6)
Mean Plasma Glucose: 93.93 mg/dL
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
## 2020-05-11 NOTE — Progress Notes (Signed)
[facility] NOVEL CORONAVIRUS (COVID-19) DAILY CHECK-OFF SYMPTOMS - answer yes or no to each - every day NO YES  Have you had a fever in the past 24 hours?  . Fever (Temp > 37.80C / 100F) X   Have you had any of these symptoms in the past 24 hours? . New Cough .  Sore Throat  .  Shortness of Breath .  Difficulty Breathing .  Unexplained Body Aches   X   Have you had any one of these symptoms in the past 24 hours not related to allergies?   . Runny Nose .  Nasal Congestion .  Sneezing   X   If you have had runny nose, nasal congestion, sneezing in the past 24 hours, has it worsened?  X   EXPOSURES - check yes or no X   Have you traveled outside the state in the past 14 days?  X   Have you been in contact with someone with a confirmed diagnosis of COVID-19 or PUI in the past 14 days without wearing appropriate PPE?  X   Have you been living in the same home as a person with confirmed diagnosis of COVID-19 or a PUI (household contact)?    X   Have you been diagnosed with COVID-19?    X              What to do next: Answered NO to all: Answered YES to anything:   Proceed with unit schedule Follow the [facility] Inpatient Flowsheet.   Nelline Lio K. Zackarie Chason MSN, RN, [facility] [phone] 

## 2020-05-11 NOTE — Progress Notes (Signed)
[facility] MD Progress Note  05/11/2020 9:18 AM Armen Pickup SHENETTA SCHNACKENBERG  MRN:  [id number] Subjective: Patient states "I guess I am okay", currently denies medication side effects.  Denies suicidal ideations. Objective : I have reviewed chart notes and have met with patient. 29 year old female, no children, currently homeless. Presented to ED on 7/20.  Initially presented with emotional lability, anger, disorganized thought process and speech.  In ED made suicidal statements, stating which should self it had access to a weapon.  She reported poor sleep which she attributed in part to homelessness.  She also endorsed hallucinations and described a recent "terrifying" experience of seeing a large strange object in her boyfriend garage.  She reported other symptoms suggestive of mania such as racing thoughts, irritability, increased impulsivity.  She reports past history of bipolar disorder diagnoses and regular cannabis and intermittent methamphetamine abuse.  Admission UDS positive for amphetamines and cannabis.  Today patient presents with some improvement.. No current psychomotor agitation or overt restlessness.  No pressured or loud speech at this time.  Reports she slept better last night. Currently denies suicidal ideations. At her request and with her expressed consent I spoke with her boyfriend.  Boyfriend reiterated he is supportive of her, stated that homelessness has been a significant stressor, states that he does feel that she has an underlying mood disorder independent from substance/drug use. Labs reviewed-TSH 0.40, hemoglobin A1c 4.9, lipid panel unremarkable, EKG NSR QTC 414   Principal Problem: Substance-induced mood disorder versus bipolar disorder Diagnosis: Active Problems:   Bipolar disorder ([place])  Total Time spent with patient: 20 minutes  Past Psychiatric History:   Past Medical History:  Past Medical History:  Diagnosis Date  . Bipolar 1 disorder ([place])   . Depression   . HPV  (human papilloma virus) anogenital infection   . PTSD (post-traumatic stress disorder)     Past Surgical History:  Procedure Laterality Date  . HERNIA REPAIR     Family History:  Family History  Problem Relation Age of Onset  . OCD Brother    Family Psychiatric  History:  Social History:  Social History   Substance and Sexual Activity  Alcohol Use Yes   Comment: occ has a beer      Social History   Substance and Sexual Activity  Drug Use Yes  . Types: Marijuana    Social History   Socioeconomic History  . Marital status: Single    Spouse name: Not on file  . Number of children: Not on file  . Years of education: Not on file  . Highest education level: Not on file  Occupational History  . Not on file  Tobacco Use  . Smoking status: Current Every Day Smoker    Packs/day: 1.00    Years: 6.00    Pack years: 6.00    Types: Cigarettes  . Smokeless tobacco: Never Used  Vaping Use  . Vaping Use: Never used  Substance and Sexual Activity  . Alcohol use: Yes    Comment: occ has a beer   . Drug use: Yes    Types: Marijuana  . Sexual activity: Not on file  Other Topics Concern  . Not on file  Social History Narrative  . Not on file   Social Determinants of Health   Financial Resource Strain:   . Difficulty of Paying Living Expenses:   Food Insecurity:   . Worried About Charity fundraiser in the Last Year:   . Arboriculturist in  the Last Year:   Transportation Needs:   . Film/video editor (Medical):   Marland Kitchen Lack of Transportation (Non-Medical):   Physical Activity:   . Days of Exercise per Week:   . Minutes of Exercise per Session:   Stress:   . Feeling of Stress :   Social Connections:   . Frequency of Communication with Friends and Family:   . Frequency of Social Gatherings with Friends and Family:   . Attends Religious Services:   . Active Member of Clubs or Organizations:   . Attends Archivist Meetings:   Marland Kitchen Marital Status:     Additional Social History:   Sleep: Improving  Appetite:  Improving  Current Medications: Current Facility-Administered Medications  Medication Dose Route Frequency Provider Last Rate Last Admin  . acetaminophen (TYLENOL) tablet 650 mg  650 mg Oral Q6H PRN Emmaline Kluver, FNP      . alum & mag hydroxide-simeth (MAALOX/MYLANTA) 200-200-20 MG/5ML suspension 30 mL  30 mL Oral Q4H PRN Emmaline Kluver, FNP      . hydrOXYzine (ATARAX/VISTARIL) tablet 25 mg  25 mg Oral TID PRN Emmaline Kluver, FNP   25 mg at 05/09/20 2122  . OLANZapine zydis (ZYPREXA) disintegrating tablet 5 mg  5 mg Oral Q8H PRN Saman Umstead, Myer Peer, MD       And  . LORazepam (ATIVAN) tablet 1 mg  1 mg Oral PRN Nekayla Heider, Myer Peer, MD       And  . ziprasidone (GEODON) injection 10 mg  10 mg Intramuscular PRN Daana Petrasek A, MD      . magnesium hydroxide (MILK OF MAGNESIA) suspension 30 mL  30 mL Oral Daily PRN Emmaline Kluver, FNP      . OLANZapine (ZYPREXA) tablet 5 mg  5 mg Oral QHS Oliviarose Punch, Myer Peer, MD   5 mg at 05/10/20 2032    Lab Results:  Results for orders placed or performed during the hospital encounter of 05/09/20 (from the past 48 hour(s))  Lipid panel     Status: None   Collection Time: 05/10/20  5:40 PM  Result Value Ref Range   Cholesterol 149 0 - 200 mg/dL   Triglycerides 78 <150 mg/dL   HDL 47 >40 mg/dL   Total CHOL/HDL Ratio 3.2 RATIO   VLDL 16 0 - 40 mg/dL   LDL Cholesterol 86 0 - 99 mg/dL    Comment:        Total Cholesterol/HDL:CHD Risk Coronary Heart Disease Risk Table                     Men   Women  1/2 Average Risk   3.4   3.3  Average Risk       5.0   4.4  2 X Average Risk   9.6   7.1  3 X Average Risk  23.4   11.0        Use the calculated Patient Ratio above and the CHD Risk Table to determine the patient's CHD Risk.        ATP III CLASSIFICATION (LDL):  <100     mg/dL   Optimal  100-129  mg/dL   Near or Above                    Optimal  130-159  mg/dL   Borderline  160-189  mg/dL    High  >190     mg/dL   Very High Performed at  [facility], [employer] [address], [place], [place] [address]   TSH     Status: None   Collection Time: 05/10/20  5:40 PM  Result Value Ref Range   TSH 0.406 0.350 - 4.500 uIU/mL    Comment: Performed by a 3rd Generation assay with a functional sensitivity of <=0.01 uIU/mL. Performed at [facility], [place] [address] [place], [place] [address]   Hemoglobin A1c     Status: None   Collection Time: 05/10/20  5:40 PM  Result Value Ref Range   Hgb A1c MFr Bld 4.9 4.8 - 5.6 %    Comment: (NOTE) Pre diabetes:          5.7%-6.4%  Diabetes:              >6.4%  Glycemic control for   <7.0% adults with diabetes    Mean Plasma Glucose 93.93 mg/dL    Comment: Performed at [facility] [address] [place],  [address]    Blood Alcohol level:  Lab Results  Component Value Date   [facility] <10 05/06/2020   ETH <10 02/72/5366    Metabolic Disorder Labs: Lab Results  Component Value Date   HGBA1C 4.9 05/10/2020   MPG 93.93 05/10/2020   No results found for: PROLACTIN Lab Results  Component Value Date   CHOL 149 05/10/2020   TRIG 78 05/10/2020   HDL 47 05/10/2020   CHOLHDL 3.2 05/10/2020   VLDL 16 05/10/2020   LDLCALC 86 05/10/2020    Physical Findings: AIMS:  , ,  ,  ,    CIWA:    COWS:     Musculoskeletal: Strength & Muscle Tone: within normal limits Gait & Station: normal Patient leans: N/A  Psychiatric Specialty Exam: Physical Exam  Review of Systems no chest pain, no shortness of breath, no cough, no vomiting  Blood pressure (!) 122/59, pulse 81, temperature 97.7 F (36.5 C), temperature source Oral, resp. rate 18, height 5' 7"  (1.702 m), weight 62.1 kg, SpO2 100 %.Body mass index is 21.46 kg/m.  General Appearance: Fairly Groomed  Eye Contact:  Good  Speech:  Normal Rate-not pressured  Volume:  Normal  Mood:  Reports she is feeling better  Affect:  Vaguely anxious,  reactive, not irritable or overtly expansive at this time  Thought Process:  Linear and Descriptions of Associations: Intact some tangentiality with open-ended questions  Orientation:  Full (Time, Place, and Person)  Thought Content:  Currently denies hallucinations and does not appear internally preoccupied.  No delusions are currently expressed  Suicidal Thoughts:  No denies suicidal or self-injurious ideations  Homicidal Thoughts:  No  Memory:  Recent and remote grossly intact  Judgement:  Other:  Fair/improving  Insight:  Fair  Psychomotor Activity:  Normal no psychomotor restlessness or agitation at this time  Concentration:  Concentration: Improving and Attention Span: Improving  Recall:  Good  Fund of Knowledge:  Good  Language:  Good  Akathisia:  Negative  Handed:  Right  AIMS (if indicated):     Assets:  Communication Skills Desire for Improvement Resilience  ADL's:  Intact  Cognition:  WNL  Sleep:  Number of Hours: 6.75   Assessment: 29 year old female, no children, currently homeless. Presented to ED on 7/20.  Initially presented with emotional lability, anger, disorganized thought process and speech.  In ED made suicidal statements, stating which should self it had access to a weapon.  She reported poor sleep which she attributed in  part to homelessness.  She also endorsed hallucinations and described a recent "terrifying" experience of seeing a large strange object in her boyfriend garage.  She reported other symptoms suggestive of mania such as racing thoughts, irritability, increased impulsivity.  She reports past history of bipolar disorder diagnoses and regular cannabis and intermittent methamphetamine abuse.  Admission UDS positive for amphetamines and cannabis.  Today patient presents with improvement.  At this time is not presenting with psychomotor agitation or restlessness, describes improving sleep, is not presenting with pressured speech.  Denies hallucinations and  does not appear internally preoccupied.  Currently denies SI.  Tolerating Zyprexa well thus far-as presenting with improvement we will continue at current dose for now.  Treatment Plan Summary: Daily contact with patient to assess and evaluate symptoms and progress in treatment, Medication management, Plan Inpatient treatment and Medication as below Encourage group and milieu participation Encourage efforts to work on sobriety and relapse prevention Continue Zyprexa 5 mg nightly for mood disorder/psychosis Continue agitation protocol as needed for acute agitation Treatment team working on disposition planning options Jenne Campus, MD 05/11/2020, 9:18 AM

## 2020-05-11 NOTE — [facility] Group Notes (Signed)
[facility] LCSW Group Therapy Note  Date/Time:  05/11/2020  11:00AM-12:00PM  Type of Therapy and Topic:  Group Therapy:  Music and Mood  Participation Level:  Active   Description of Group: In this process group, members listened to a variety of genres of music and identified that different types of music evoke different responses.  Patients were encouraged to identify music that was soothing for them and music that was energizing for them.  Patients discussed how this knowledge can help with wellness and recovery in various ways including managing depression and anxiety as well as encouraging healthy sleep habits.    Therapeutic Goals: Patients will explore the impact of different varieties of music on mood Patients will verbalize the thoughts they have when listening to different types of music Patients will identify music that is soothing to them as well as music that is energizing to them Patients will discuss how to use this knowledge to assist in maintaining wellness and recovery Patients will explore the use of music as a coping skill  Summary of Patient Progress:  At the beginning of group, patient expressed that music is therapeutic for her.  She listened attentively and appropriately for the most part.  At one point she did get into a verbal altercation with another patient that she insulted, who ignored her for awhile but then decided to ask her why she was talking about him like that.  She made it clear she does not care for him, stating "You've got a bad vibe."  It was difficult to get the two of them to disengaged from this conversation and return to neutral ground.   At the end of group, patient expressed gratitude for the opportunity to listen to the songs.  She did stay away from talking to the female patient that she appears purposefully to try to to antagonize.    Therapeutic Modalities: Solution Focused Brief Therapy Activity   Ambrose Mantle, LCSW

## 2020-05-11 NOTE — Progress Notes (Signed)
Patient has been up in the dayroom interacting appropriately with peers. She came to Clinical research associate and asked about her Child psychotherapist and if she would be able to help her with housing. Writer in formed her that she should be in on tomorrow. She requested anxiety medication with her scheduled medicine. Support givne and safety maintained on unit with 15 min checks.

## 2020-05-11 NOTE — Progress Notes (Signed)
  Pt is caucasian  female  of 29 y.o. , DOB 1991-06-26, MRN  [id number]  presents Voluntary with SI and [place] Behavior  Hx of Bipolar, substance abuse.    Patient Self Inventory Sheet  reports fair appetite, energy normal, and poor sleeping pattern without mediaction. Pt rates  Depression 2 out of 10, hopelessness 3 out of 10, and anxiety at 6 out of 10. Pt reports SI without a  plan. Denies  HI or AVH.  Pt reports LBM yesterday, physical problems include  pain at a 1 out of 10. Pain is located in the left shoulder leg, and head. Pt denies using any medications for pain. Vitals signs WNL and being monitored. Pt states most important goals are to work with doctors and social workers for release.  Medication given as Rx, no adverse reaction observed, safety maintained with q15 minute checks.   Einar Crow. Melvyn Neth MSN, RN, [facility] [phone]

## 2020-05-11 NOTE — Progress Notes (Signed)
   05/11/20 2030  COVID-19 Daily Checkoff  Have you had a fever (temp > 37.80C/100F)  in the past 24 hours?  No  If you have had runny nose, nasal congestion, sneezing in the past 24 hours, has it worsened? No  COVID-19 EXPOSURE  Have you traveled outside the state in the past 14 days? No  Have you been in contact with someone with a confirmed diagnosis of COVID-19 or PUI in the past 14 days without wearing appropriate PPE? No  Have you been living in the same home as a person with confirmed diagnosis of COVID-19 or a PUI (household contact)? No  Have you been diagnosed with COVID-19? No

## 2020-05-12 DIAGNOSIS — F312 Bipolar disorder, current episode manic severe with psychotic features: Secondary | ICD-10-CM | POA: Diagnosis not present

## 2020-05-12 NOTE — Progress Notes (Signed)
Recreation Therapy Notes  Date: 7.26.21 Time: 1000 Location: 500 Hall Dayroom  Group Topic: Wellness  Goal Area(s) Addresses:  Patient will define components of whole wellness. Patient will verbalize benefit of whole wellness.  Intervention: Music  Activity: Exercise.  LRT led patients in a series of stretches to loosen up.  Patients then took turns leading group in various exercises.  Patients were told to get water and take breaks as needed.  Patients were also given the opportunity to request songs they wanted to hear when the exercises were done.  Education: Wellness, Discharge Planning.   Education Outcome: Acknowledges education/In group clarification offered/Needs additional education.   Clinical Observations/Feedback: Pt did not attend group session.     Jerrold Haskell, LRT/CTRS         Marvalene Barrett A 05/12/2020 12:29 PM 

## 2020-05-12 NOTE — Progress Notes (Signed)
D:  Patient's self inventory sheet, patient has fair sleep, vistaril was helpful.  Good appetite, normal energy level, good concentration.  Rated depression 7, denied hopeless, anxiety 8.  Denied withdrawals, checked agitation.  Denied SI.  Physical problems, slight stomach pain.  Goal is discharge.  Plans to behave.  "Thanks for everything."  Does have discharge plans. A:  Medications administered per MD orders.  Emotional support and encouragement given patient. R:  Denied SI and HI, contracts for safety.  Denied A/V hallucinations.  Safety maintained with 15 minute checks.

## 2020-05-12 NOTE — [facility] Suicide Risk Assessment (Addendum)
[facility] INPATIENT:  Family/Significant Other Suicide Prevention Education    Suicide Prevention Education:  Contact Attempts: Mary Sella has been identified by the patient as the family member/significant other with whom the patient will be residing, and identified as the person(s) who will aid the patient in the event of a mental health crisis.  With written consent from the patient, two attempts were made to provide suicide prevention education, prior to and/or following the patient's discharge.  We were unsuccessful in providing suicide prevention education.  A suicide education pamphlet was given to the patient to share with family/significant other.   Date and time of first attempt: 05/12/20 10:38AM Date and time of second attempt: 05/12/20 10:50AM   Unable to reach supports, SPE pamphlet placed on chart for patient to share with supports at discharge.     Ruthann Cancer MSW, LCSW Clincal Social Worker  [facility]

## 2020-05-12 NOTE — Plan of Care (Signed)
Nurse discussed anxiety, depression and coping skills with patient.  

## 2020-05-12 NOTE — Progress Notes (Addendum)
   05/12/20 2040  Psych Admission Type (Psych Patients Only)  Admission Status Voluntary  Psychosocial Assessment  Patient Complaints Irritability  Eye Contact Fair  Facial Expression Animated  Affect Labile  Speech Argumentative;Logical/coherent  Interaction Assertive  Motor Activity Fidgety  Appearance/Hygiene In hospital gown  Behavior Characteristics Cooperative;Irritable  Mood Labile  Thought Process  Coherency WDL  Content Preoccupation;Blaming others  Delusions None reported or observed  Perception WDL  Hallucination None reported or observed  Judgment Impaired  Confusion None  Danger to Self  Current suicidal ideation? Denies  Danger to Others  Danger to Others None reported or observed   Pt loud and cursing about how she is disappointed that she is still here. "This place is making me more anxious being locked in here and not being able to leave."  Pt denies SI, HI, AVH and pain.

## 2020-05-12 NOTE — Progress Notes (Signed)
[facility] MD Progress Note  05/12/2020 10:45 AM Carol Miles  MRN:  [id number] Subjective:  Pt is a 29 year old with a past history of PTSD, Bipolar disorder, Polysubstance abuse transferred to [facility] from [facility] for suicidal ideation. Pt presented voluntarily to ED with suicidal ideation.  Pt is seen and examined today. Pt is feeling better today. Pt states her mood is good. Pt states she want to go home to love someone. Pt states she called her mom and states "she doesn't care about me and doesn't understand about my mental illness". Pt adds "She thinks I am just doing drugs, she is so mean with me". Pt states her anxiety is high because of that. Pt states her appetite is ok but she didn't eat her breakfast today.  Pt states she slept well last night. Nursing notes indicate Pt slept for 6.75 hours. Pt denies suicidal ideation and depressed mood. Pt states she wants to hurt or punch someone if we don't left her go today. Pt states she feels like a wild animal in a cage. Pt threatened the writer by punching her fist hard on her hand that she is going to hurt if we won't let her go today. Pt is anxious to go home. Pt denies any medication side effects. Pt denies depressed mood, low mood , helplessness or guilt.  On Examination, Pt is anxious and angry. Pt made threatening comments to writer that she is going to hurt someone if we won't let her go today. Pt's speech is pressured but less than at the time of admission. Pt is not responding to internal stimuli. Pt mood is angry and anxious and her affect is congruent.   Principal Problem: <principal problem not specified> Diagnosis: Active Problems:   Bipolar disorder (HCC)  Total Time spent with patient: 20 minutes  Past Psychiatric History: PTSD, Bipolar Disorder, Polysubstance abuse disorder   Past Medical History:  Past Medical History:  Diagnosis Date  . Bipolar 1 disorder (HCC)   . Depression   . HPV (human papilloma virus) anogenital  infection   . PTSD (post-traumatic stress disorder)     Past Surgical History:  Procedure Laterality Date  . HERNIA REPAIR     Family History:  Family History  Problem Relation Age of Onset  . OCD Brother    Family Psychiatric  History: According to pt, Maternal Grandmother had Schizophrenia Social History:  Social History   Substance and Sexual Activity  Alcohol Use Yes   Comment: occ has a beer      Social History   Substance and Sexual Activity  Drug Use Yes  . Types: Marijuana    Social History   Socioeconomic History  . Marital status: Single    Spouse name: Not on file  . Number of children: Not on file  . Years of education: Not on file  . Highest education level: Not on file  Occupational History  . Not on file  Tobacco Use  . Smoking status: Current Every Day Smoker    Packs/day: 1.00    Years: 6.00    Pack years: 6.00    Types: Cigarettes  . Smokeless tobacco: Never Used  Vaping Use  . Vaping Use: Never used  Substance and Sexual Activity  . Alcohol use: Yes    Comment: occ has a beer   . Drug use: Yes    Types: Marijuana  . Sexual activity: Not on file  Other Topics Concern  . Not on file  Social History Narrative  . Not on file   Social Determinants of Health   Financial Resource Strain:   . Difficulty of Paying Living Expenses:   Food Insecurity:   . Worried About Programme researcher, broadcasting/film/video in the Last Year:   . Barista in the Last Year:   Transportation Needs:   . Freight forwarder (Medical):   Marland Kitchen Lack of Transportation (Non-Medical):   Physical Activity:   . Days of Exercise per Week:   . Minutes of Exercise per Session:   Stress:   . Feeling of Stress :   Social Connections:   . Frequency of Communication with Friends and Family:   . Frequency of Social Gatherings with Friends and Family:   . Attends Religious Services:   . Active Member of Clubs or Organizations:   . Attends Banker Meetings:   Marland Kitchen Marital  Status:    Additional Social History:                         Sleep: Good  Appetite:  Good  Current Medications: Current Facility-Administered Medications  Medication Dose Route Frequency Provider Last Rate Last Admin  . acetaminophen (TYLENOL) tablet 650 mg  650 mg Oral Q6H PRN Patrcia Dolly, FNP      . alum & mag hydroxide-simeth (MAALOX/MYLANTA) 200-200-20 MG/5ML suspension 30 mL  30 mL Oral Q4H PRN Patrcia Dolly, FNP      . hydrOXYzine (ATARAX/VISTARIL) tablet 25 mg  25 mg Oral TID PRN Patrcia Dolly, FNP   25 mg at 05/11/20 2027  . OLANZapine zydis (ZYPREXA) disintegrating tablet 5 mg  5 mg Oral Q8H PRN Cobos, Rockey Situ, MD   5 mg at 05/12/20 0738   And  . LORazepam (ATIVAN) tablet 1 mg  1 mg Oral PRN Cobos, Rockey Situ, MD       And  . ziprasidone (GEODON) injection 10 mg  10 mg Intramuscular PRN Cobos, Fernando A, MD      . magnesium hydroxide (MILK OF MAGNESIA) suspension 30 mL  30 mL Oral Daily PRN Patrcia Dolly, FNP      . OLANZapine (ZYPREXA) tablet 5 mg  5 mg Oral QHS Cobos, Rockey Situ, MD   5 mg at 05/11/20 2027    Lab Results:  Results for orders placed or performed during the hospital encounter of 05/09/20 (from the past 48 hour(s))  Lipid panel     Status: None   Collection Time: 05/10/20  5:40 PM  Result Value Ref Range   Cholesterol 149 0 - 200 mg/dL   Triglycerides 78 <053 mg/dL   HDL 47 >97 mg/dL   Total CHOL/HDL Ratio 3.2 RATIO   VLDL 16 0 - 40 mg/dL   LDL Cholesterol 86 0 - 99 mg/dL    Comment:        Total Cholesterol/HDL:CHD Risk Coronary Heart Disease Risk Table                     Men   Women  1/2 Average Risk   3.4   3.3  Average Risk       5.0   4.4  2 X Average Risk   9.6   7.1  3 X Average Risk  23.4   11.0        Use the calculated Patient Ratio above and the CHD Risk Table to determine the patient's CHD Risk.  ATP III CLASSIFICATION (LDL):  <100     mg/dL   Optimal  449-201  mg/dL   Near or Above                     Optimal  130-159  mg/dL   Borderline  007-121  mg/dL   High  >975     mg/dL   Very High Performed at [facility], [address] [place], [place] [address]   TSH     Status: None   Collection Time: 05/10/20  5:40 PM  Result Value Ref Range   TSH 0.406 0.350 - 4.500 uIU/mL    Comment: Performed by a 3rd Generation assay with a functional sensitivity of <=0.01 uIU/mL. Performed at [facility], [address], [place], [place] [address]   Hemoglobin A1c     Status: None   Collection Time: 05/10/20  5:40 PM  Result Value Ref Range   Hgb A1c MFr Bld 4.9 4.8 - 5.6 %    Comment: (NOTE) Pre diabetes:          5.7%-6.4%  Diabetes:              >6.4%  Glycemic control for   <7.0% adults with diabetes    Mean Plasma Glucose 93.93 mg/dL    Comment: Performed at [facility], [address], [place], [place] [address]    Blood Alcohol level:  Lab Results  Component Value Date   [facility] <10 05/06/2020   ETH <10 05/17/2019    Metabolic Disorder Labs: Lab Results  Component Value Date   HGBA1C 4.9 05/10/2020   MPG 93.93 05/10/2020   No results found for: PROLACTIN Lab Results  Component Value Date   CHOL 149 05/10/2020   TRIG 78 05/10/2020   HDL 47 05/10/2020   CHOLHDL 3.2 05/10/2020   VLDL 16 05/10/2020   LDLCALC 86 05/10/2020    Physical Findings: AIMS:  , ,  ,  ,    CIWA:    COWS:     Musculoskeletal: Strength & Muscle Tone: within normal limits Gait & Station: normal Patient leans: N/A  Psychiatric Specialty Exam: Physical Exam  Review of Systems  Blood pressure 116/71, pulse 95, temperature 97.7 F (36.5 C), temperature source Oral, resp. rate 18, height 5\' 7"  (1.702 m), weight 62.1 kg, SpO2 100 %.Body mass index is 21.46 kg/m.  General Appearance: Casual  Eye Contact:  Fair  Speech:  Pressured  Volume:  Normal  Mood:  Angry and Anxious  Affect:  Congruent  Thought Process:  Disorganized and Descriptions of  Associations: Tangential  Orientation:  Full (Time, Place, and Person)  Thought Content:  Delusions  Suicidal Thoughts:  No  Homicidal Thoughts:  No, Pt wants to punch anyone if she doesn't get discharged today.  Memory:  Immediate;   Fair Recent;   Fair Remote;   Fair  Judgement:  Fair  Insight:  Fair  Psychomotor Activity:  Normal  Concentration:  Concentration: Good and Attention Span: Good  Recall:  Good  Fund of Knowledge:  Good  Language:  Good  Akathisia:  No  Handed:  Right  AIMS (if indicated):     Assets:  Desire for Improvement Resilience  ADL's:  Intact  Cognition:  WNL  Sleep:  Number of Hours: 6.75     Treatment Plan Summary: Pt is seen and examined today. Pt is feeling better today. Pt states her mood is good. Pt  states she want to go home to love someone. Pt states she called her mom and states "she doesn't care about me and doesn't understand about my mental illness". Pt adds "She thinks I am just doing drugs, she is so mean with me". Pt states her anxiety is high because of that. Pt states her appetite is ok but she didn't eat her breakfast today.  Pt states she slept well last night. Nursing notes indicate Pt slept for 6.75 hours. Pt denies suicidal ideation and depressed mood. Pt states she wants to hurt or punch someone if we don't left her go today. Pt states she feels like a wild animal in a cage. Pt threatened the writer by punching her fist hard on her hand that she is going to hurt if we won't let her go today. Pt is anxious to go home. Pt denies any medication side effects. Pt denies depressed mood, low mood , helplessness or guilt. Pr denies any withdrawal effects. On Examination, Pt is anxious and angry. Pt made threatening comments to writer that she is going to hurt someone if we won't let her go today. Pt's speech is pressured but less than at the time of admission. Pt is not responding to internal stimuli. Pt mood is angry and anxious and her affect is  congruent.   Plan- Daily contact with patient to assess and evaluate symptoms and progress in treatment Vitals- 116/71 mmHg, Pulse-95/min -Pt has made threatening comments towards Clinical research associatewriter. Monitor for aggression.  -Continue Inj Geodon 10 mg I/M for Agitation -Monitor for suicidal ideation. -Monitor vitals -Monitor for medication side effects. -Continue Vistaril 25 mg TID PRN for anxiety -Continue Agitation protocol -Continue Zyprexa 5 mg QHS -Continue Zyprexa 5 mg Q 8 H PRN for Agitation -Continue Ativan 1mg  PRN for Agitation   Karsten RoVandana  Militza Devery, MD 05/12/2020, 10:45 AM

## 2020-05-12 NOTE — Tx Team (Cosign Needed)
Interdisciplinary Treatment and Diagnostic Plan Update  05/12/2020 Time of Session: 11:00am Carol Miles MRN: [id number]  Principal Diagnosis: <principal problem not specified>  Secondary Diagnoses: Active Problems:   Bipolar disorder ([place])   Current Medications:  Current Facility-Administered Medications  Medication Dose Route Frequency Provider Last Rate Last Admin  . acetaminophen (TYLENOL) tablet 650 mg  650 mg Oral Q6H PRN Emmaline Kluver, FNP      . alum & mag hydroxide-simeth (MAALOX/MYLANTA) 200-200-20 MG/5ML suspension 30 mL  30 mL Oral Q4H PRN Emmaline Kluver, FNP      . hydrOXYzine (ATARAX/VISTARIL) tablet 25 mg  25 mg Oral TID PRN Emmaline Kluver, FNP   25 mg at 05/11/20 2027  . OLANZapine zydis (ZYPREXA) disintegrating tablet 5 mg  5 mg Oral Q8H PRN Cobos, Myer Peer, MD   5 mg at 05/12/20 0738   And  . LORazepam (ATIVAN) tablet 1 mg  1 mg Oral PRN Cobos, Myer Peer, MD       And  . ziprasidone (GEODON) injection 10 mg  10 mg Intramuscular PRN Cobos, Fernando A, MD      . magnesium hydroxide (MILK OF MAGNESIA) suspension 30 mL  30 mL Oral Daily PRN Emmaline Kluver, FNP      . OLANZapine (ZYPREXA) tablet 5 mg  5 mg Oral QHS Cobos, Myer Peer, MD   5 mg at 05/11/20 2027   PTA Medications: Medications Prior to Admission  Medication Sig Dispense Refill Last Dose  . busPIRone (BUSPAR) 5 MG tablet Take 5 mg by mouth 2 (two) times daily.     . citalopram (CELEXA) 20 MG tablet Take 20 mg by mouth daily.     . meloxicam (MOBIC) 15 MG tablet Take 1 tablet (15 mg total) by mouth daily. (Patient not taking: Reported on 05/06/2020) 30 tablet 0   . OLANZapine (ZYPREXA) 5 MG tablet Take 5 mg by mouth at bedtime.      . risperiDONE (RISPERDAL) 0.25 MG tablet Take 0.25 mg by mouth in the morning. 1 tablet each morning.     . ziprasidone (GEODON) 20 MG capsule Take 20 mg by mouth daily with supper.        Patient Stressors: Financial difficulties Occupational concerns Substance  abuse  Patient Strengths: Motivation for treatment/growth Supportive family/friends  Treatment Modalities: Medication Management, Group therapy, Case management,  1 to 1 session with clinician, Psychoeducation, Recreational therapy.   Physician Treatment Plan for Primary Diagnosis: <principal problem not specified> Long Term Goal(s): Improvement in symptoms so as ready for discharge Improvement in symptoms so as ready for discharge   Short Term Goals: Ability to identify changes in lifestyle to reduce recurrence of condition will improve Ability to verbalize feelings will improve Ability to disclose and discuss suicidal ideas Ability to demonstrate self-control will improve Ability to identify and develop effective coping behaviors will improve Ability to maintain clinical measurements within normal limits will improve Compliance with prescribed medications will improve Ability to identify triggers associated with substance abuse/mental health issues will improve Ability to identify changes in lifestyle to reduce recurrence of condition will improve Ability to verbalize feelings will improve Ability to disclose and discuss suicidal ideas Ability to demonstrate self-control will improve Ability to identify and develop effective coping behaviors will improve Ability to maintain clinical measurements within normal limits will improve Compliance with prescribed medications will improve Ability to identify triggers associated with substance abuse/mental health issues will improve  Medication Management: Evaluate patient's response, side effects,  and tolerance of medication regimen.  Therapeutic Interventions: 1 to 1 sessions, Unit Group sessions and Medication administration.  Evaluation of Outcomes: Not Met  Physician Treatment Plan for Secondary Diagnosis: Active Problems:   Bipolar disorder ([place])  Long Term Goal(s): Improvement in symptoms so as ready for discharge Improvement  in symptoms so as ready for discharge   Short Term Goals: Ability to identify changes in lifestyle to reduce recurrence of condition will improve Ability to verbalize feelings will improve Ability to disclose and discuss suicidal ideas Ability to demonstrate self-control will improve Ability to identify and develop effective coping behaviors will improve Ability to maintain clinical measurements within normal limits will improve Compliance with prescribed medications will improve Ability to identify triggers associated with substance abuse/mental health issues will improve Ability to identify changes in lifestyle to reduce recurrence of condition will improve Ability to verbalize feelings will improve Ability to disclose and discuss suicidal ideas Ability to demonstrate self-control will improve Ability to identify and develop effective coping behaviors will improve Ability to maintain clinical measurements within normal limits will improve Compliance with prescribed medications will improve Ability to identify triggers associated with substance abuse/mental health issues will improve     Medication Management: Evaluate patient's response, side effects, and tolerance of medication regimen.  Therapeutic Interventions: 1 to 1 sessions, Unit Group sessions and Medication administration.  Evaluation of Outcomes: Not Met   RN Treatment Plan for Primary Diagnosis: <principal problem not specified> Long Term Goal(s): Knowledge of disease and therapeutic regimen to maintain health will improve  Short Term Goals: Ability to remain free from injury will improve, Ability to verbalize frustration and anger appropriately will improve, Ability to demonstrate self-control, Ability to verbalize feelings will improve, Ability to identify and develop effective coping behaviors will improve and Compliance with prescribed medications will improve  Medication Management: RN will administer medications as  ordered by provider, will assess and evaluate patient's response and provide education to patient for prescribed medication. RN will report any adverse and/or side effects to prescribing provider.  Therapeutic Interventions: 1 on 1 counseling sessions, Psychoeducation, Medication administration, Evaluate responses to treatment, Monitor vital signs and CBGs as ordered, Perform/monitor CIWA, COWS, AIMS and Fall Risk screenings as ordered, Perform wound care treatments as ordered.  Evaluation of Outcomes: Not Met   LCSW Treatment Plan for Primary Diagnosis: <principal problem not specified> Long Term Goal(s): Safe transition to appropriate next level of care at discharge, Engage patient in therapeutic group addressing interpersonal concerns.  Short Term Goals: Engage patient in aftercare planning with referrals and resources, Increase social support, Increase ability to appropriately verbalize feelings, Increase emotional regulation, Identify triggers associated with mental health/substance abuse issues and Increase skills for wellness and recovery  Therapeutic Interventions: Assess for all discharge needs, 1 to 1 time with Social worker, Explore available resources and support systems, Assess for adequacy in community support network, Educate family and significant other(s) on suicide prevention, Complete Psychosocial Assessment, Interpersonal group therapy.  Evaluation of Outcomes: Not Met   Progress in Treatment: Attending groups: Yes. Participating in groups: Yes. Taking medication as prescribed: Yes. Toleration medication: Yes. Family/Significant other contact made: No, will contact:  Boyfriend. Patient understands diagnosis: Yes. Discussing patient identified problems/goals with staff: Yes. Medical problems stabilized or resolved: Yes. Denies suicidal/homicidal ideation: Yes. Issues/concerns per patient self-inventory: No.  New problem(s) identified: Yes, Describe:  Lack of stable  housing, limited income.  New Short Term/Long Term Goal(s): medication stabilization, elimination of SI thoughts, development of comprehensive mental  wellness plan.   Patient Goals:  "I was IVC'd. To get my medicine balanced, to not want to kill myself"  Discharge Plan or Barriers: Patient recently admitted. CSW will continue to follow and assess for appropriate referrals and possible discharge planning.   Reason for Continuation of Hospitalization: Aggression Depression Medication stabilization  Estimated Length of Stay: 3-5 days  Attendees: Patient: Carol Miles 05/12/2020  Physician: Dr. Parke Poisson 05/12/2020  Nursing:  05/12/2020   RN Care Manager: 05/12/2020  Social Worker: Darletta Moll, [place] 05/12/2020   Recreational Therapist:  05/12/2020   Other:  05/12/2020   Other:  05/12/2020   Other: 05/12/2020     Scribe for Treatment Team: Vassie Moselle, LCSW 05/12/2020 11:24 AM

## 2020-05-13 DIAGNOSIS — F312 Bipolar disorder, current episode manic severe with psychotic features: Secondary | ICD-10-CM | POA: Diagnosis not present

## 2020-05-13 MED ORDER — HYDROXYZINE HCL 25 MG PO TABS: 25.0000 mg | ORAL_TABLET | Freq: Three times a day (TID) | ORAL | 0 refills | Status: DC | PRN

## 2020-05-13 NOTE — Progress Notes (Signed)
Pt discharged to lobby. Pt was stable and appreciative at that time. All papers and prescriptions were given and valuables returned. Verbal understanding expressed. Denies SI/HI and A/VH. Pt given opportunity to express concerns and ask questions.  

## 2020-05-13 NOTE — [facility]HH Suicide Risk Assessment (Signed)
[facility] Discharge Suicide Risk Assessment   Principal Problem: <principal problem not specified> Discharge Diagnoses: Active Problems:   Bipolar disorder (HCC)   Total Time spent with patient: 30 minutes  Musculoskeletal: Strength & Muscle Tone: within normal limits Gait & Station: normal Patient leans: N/A  Psychiatric Specialty Exam: Review of Systems no headache, no chest pain, no cough, no vomiting, no fever or chills   Blood pressure (!) 112/53, pulse 79, temperature 98 F (36.7 C), temperature source Oral, resp. rate 18, height 5\' 7"  (1.702 m), weight 62.1 kg, SpO2 100 %.Body mass index is 21.46 kg/m.  General Appearance: Casual  Eye Contact::  Good  Speech:  Normal Rate409  Volume:  Normal  Mood:  reports " my mood is OK, I feel hopeful", denies feeling depressed   Affect:  vaguely irritable,improves during session  Thought Process:  Linear and Descriptions of Associations: Intact  Orientation:  Full (Time, Place, and Person)  Thought Content:  denies hallucinations,not internally preoccupied, no delusions are expressed   Suicidal Thoughts:  No denies any suicidal or self injurious ideations, denies homicidal or violent ideations   Homicidal Thoughts:  No  Memory:  recent and remote grossly intact   Judgement:  Other:  fair / some improvement  Insight:  fair   Psychomotor Activity:  Normal  Concentration:  Good  Recall:  Good  Fund of Knowledge:Good  Language: Good  Akathisia:  Negative  Handed:  Right  AIMS (if indicated):     Assets:  Desire for Improvement Resilience  Sleep:  Number of Hours: 7  Cognition: WNL  ADL's:  Intact   Mental Status Per Nursing Assessment::   On Admission:  Self-harm thoughts  Demographic Factors:  29 year old female, single, no children, reports she has been homeless over recent weeks , has been living in her car recently. Currently unemployed.   Loss Factors: Homelessness, recent relapse  Historical Factors: History of  Bipolar Disorder, history of PTSD , history of substance use disorder   Risk Reduction Factors:   Positive coping skills or problem solving skills  Continued Clinical Symptoms:  At this time patient is alert,attentive, casually groomed, improved eye contact, speech is not pressured . Mood is " all right", denies feeling depressed at this time. Affect presents vaguely irritable, but tends to improve during session. Thought process is generally linear. Denies suicidal or self injurious ideations, denies homicidal or violent ideations towards anyone. Denies hallucinations, does not appear internally preoccupied. No delusions expressed at this time. Presents future oriented, states she plans to pick up a pay check , retrieve her car ( which is currently at her boyfriend's) . Patient is requesting discharge, reports improvement and readiness for discharge and states that inpatient setting and in close proximity with persons she does not know tends to make her more anxious and increase hypervigilance . Behavior on unit has been intermittently irritable but no overt psychomotor agitation or explosiveness . She is tolerating Zyprexa well , denies side effects, which we reviewed, including potential risk of weight gain, sedation ( as well as importance of not driving if sedated ) , and potential for motor and metabolic side effects. With her express consent I attempted to reach boyfriend for collateral information - no answer. She is not currently providing consent to speak with mother or other family members  We reviewed treatment options, including referral to rehab or to  shelter or to 37. Patient currently declines  She states she is motivated in  sobriety and abstinence, and states " I am definitely going to use any more drugs ".   * Patient is requesting discharge- I have reviewed case with treatment team and with Dr. Lucianne Muss, medical director. It is agreed there are no current grounds  for involuntary commitment .     Cognitive Features That Contribute To Risk:  No gross cognitive deficits noted upon discharge. Is alert , attentive, and oriented x 3   Suicide Risk:  Mild:  Suicidal ideation of limited frequency, intensity, duration, and specificity.  There are no identifiable plans, no associated intent, mild dysphoria and related symptoms, good self-control (both objective and subjective assessment), few other risk factors, and identifiable protective factors, including available and accessible social support.   Follow-up Information    [facility] Follow up on 05/15/2020.   Why: You have an intake appointment on 05/15/20 at 2:40 pm for outpatient medication management and therapy services.  This will be a Virtual appointment.  Contact information: [address] [place] [address] [phone]               Plan Of Care/Follow-up recommendations:  Activity:  as tolerated  Diet:  regular  Tests:  NA Other:  See below  Patient is expressing readiness for discharge and requesting discharge /has submitted letter requesting discharge. As reviewed above , there are no current grounds for involuntary commitment. She states she plans to continue staying in her car, but has the option of staying with her boyfriend and his parents if she wants . She plans to follow up as above.     Craige Cotta, MD 05/13/2020, 12:30 PM

## 2020-05-13 NOTE — Progress Notes (Signed)
  [facility] Adult Case Management Discharge Plan :  Will you be returning to the same living situation after discharge:  No. Homeless, currently staying in car. At discharge, do you have transportation home?: No. Safe Transport to be arranged. Do you have the ability to pay for your medications: No. Samples to be provided at discharge.  Release of information consent forms completed and in the chart;  Patient's signature needed at discharge.  Patient to Follow up at:  Follow-up Information    [facility] Follow up on 05/15/2020.   Why: You have an intake appointment on 05/15/20 at 2:40 pm for outpatient medication management and therapy services.  This will be a Virtual appointment.  Contact information: [address] [place] [address] [phone]               Next level of care provider has access to [facility] Link:no  Safety Planning and Suicide Prevention discussed: Yes,  with patient.  Have you used any form of tobacco in the last 30 days? (Cigarettes, Smokeless Tobacco, Cigars, and/or Pipes): Yes  Has patient been referred to the Quitline?: Patient refused referral  Patient has been referred for addiction treatment: Pt. refused referral  Otelia Santee, LCSW 05/13/2020, 2:18 PM
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
## 2020-05-31 ENCOUNTER — Other Ambulatory Visit: Payer: Self-pay

## 2020-05-31 ENCOUNTER — Emergency Department (HOSPITAL_COMMUNITY)
Admission: EM | Admit: 2020-05-31 | Discharge: 2020-05-31 | Discharge status: Against Medical Advice | Payer: Self-pay | Attending: Emergency Medicine | Admitting: Emergency Medicine

## 2020-05-31 ENCOUNTER — Encounter (HOSPITAL_COMMUNITY): Payer: Self-pay

## 2020-05-31 DIAGNOSIS — F17210 Nicotine dependence, cigarettes, uncomplicated: Secondary | ICD-10-CM | POA: Insufficient documentation

## 2020-05-31 DIAGNOSIS — T7421XA Adult sexual abuse, confirmed, initial encounter: Secondary | ICD-10-CM | POA: Insufficient documentation
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
## 2020-05-31 NOTE — Consult Note (Signed)
Called to check on disposition of patient. Fransico Him, RN states provider would like consult. Will be en route.
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
## 2020-06-08 ENCOUNTER — Encounter (HOSPITAL_COMMUNITY): Payer: Self-pay | Admitting: Emergency Medicine

## 2020-06-08 ENCOUNTER — Emergency Department (HOSPITAL_COMMUNITY)
Admission: EM | Admit: 2020-06-08 | Discharge: 2020-06-10 | Disposition: A | Discharge status: Home / Self Care | Payer: Self-pay | Attending: Emergency Medicine | Admitting: Emergency Medicine

## 2020-06-08 ENCOUNTER — Other Ambulatory Visit: Payer: Self-pay

## 2020-06-08 DIAGNOSIS — F3112 Bipolar disorder, current episode manic without psychotic features, moderate: Secondary | ICD-10-CM | POA: Insufficient documentation

## 2020-06-08 DIAGNOSIS — F17210 Nicotine dependence, cigarettes, uncomplicated: Secondary | ICD-10-CM | POA: Insufficient documentation

## 2020-06-08 DIAGNOSIS — Z20822 Contact with and (suspected) exposure to covid-19: Secondary | ICD-10-CM | POA: Insufficient documentation

## 2020-06-08 DIAGNOSIS — F1520 Other stimulant dependence, uncomplicated: Secondary | ICD-10-CM | POA: Insufficient documentation

## 2020-06-08 LAB — COMPREHENSIVE METABOLIC PANEL
ALT: 15 U/L (ref 0–44)
AST: 16 U/L (ref 15–41)
Albumin: 4.5 g/dL (ref 3.5–5.0)
Alkaline Phosphatase: 54 U/L (ref 38–126)
Anion gap: 11 (ref 5–15)
BUN: 12 mg/dL (ref 6–20)
CO2: 21 mmol/L — ABNORMAL LOW (ref 22–32)
Calcium: 9.2 mg/dL (ref 8.9–10.3)
Chloride: 103 mmol/L (ref 98–111)
Creatinine, Ser: 0.74 mg/dL (ref 0.44–1.00)
GFR calc Af Amer: >60 mL/min (ref >60)
GFR calc non Af Amer: >60 mL/min (ref >60)
Glucose, Bld: 112 mg/dL — ABNORMAL HIGH (ref 70–99)
Potassium: 3.1 mmol/L — ABNORMAL LOW (ref 3.5–5.1)
Sodium: 135 mmol/L (ref 135–145)
Total Bilirubin: 1 mg/dL (ref 0.3–1.2)
Total Protein: 7.4 g/dL (ref 6.5–8.1)

## 2020-06-08 LAB — CBC WITH DIFFERENTIAL/PLATELET
Abs Immature Granulocytes: 0.02 10*3/uL (ref 0.00–0.07)
Basophils Absolute: 0 10*3/uL (ref 0.0–0.1)
Basophils Relative: 1 %
Eosinophils Absolute: 0.2 10*3/uL (ref 0.0–0.5)
Eosinophils Relative: 2 %
HCT: 39.5 % (ref 36.0–46.0)
Hemoglobin: 14 g/dL (ref 12.0–15.0)
Immature Granulocytes: 0 %
Lymphocytes Relative: 29 %
Lymphs Abs: 2.2 10*3/uL (ref 0.7–4.0)
MCH: 31.9 pg (ref 26.0–34.0)
MCHC: 35.4 g/dL (ref 30.0–36.0)
MCV: 90 fL (ref 80.0–100.0)
Monocytes Absolute: 0.6 10*3/uL (ref 0.1–1.0)
Monocytes Relative: 8 %
Neutro Abs: 4.6 10*3/uL (ref 1.7–7.7)
Neutrophils Relative %: 60 %
Platelets: 273 10*3/uL (ref 150–400)
RBC: 4.39 MIL/uL (ref 3.87–5.11)
RDW: 11.9 % (ref 11.5–15.5)
WBC: 7.6 10*3/uL (ref 4.0–10.5)
nRBC: 0 % (ref 0.0–0.2)

## 2020-06-08 LAB — ACETAMINOPHEN LEVEL: Acetaminophen (Tylenol), Serum: <10 ug/mL — ABNORMAL LOW (ref 10–30)

## 2020-06-08 LAB — RAPID URINE DRUG SCREEN, HOSP PERFORMED
Amphetamines: NOT DETECTED
Barbiturates: NOT DETECTED
Benzodiazepines: NOT DETECTED
Cocaine: NOT DETECTED
Opiates: NOT DETECTED
Tetrahydrocannabinol: NOT DETECTED

## 2020-06-08 LAB — URINALYSIS, ROUTINE W REFLEX MICROSCOPIC
Bilirubin Urine: NEGATIVE
Glucose, UA: NEGATIVE mg/dL
Hgb urine dipstick: NEGATIVE
Ketones, ur: NEGATIVE mg/dL
Leukocytes,Ua: NEGATIVE
Nitrite: NEGATIVE
Protein, ur: NEGATIVE mg/dL
Specific Gravity, Urine: 1 — ABNORMAL LOW (ref 1.005–1.030)
pH: 6 (ref 5.0–8.0)

## 2020-06-08 LAB — ETHANOL: Alcohol, Ethyl (B): <10 mg/dL (ref <10)

## 2020-06-08 LAB — SALICYLATE LEVEL: Salicylate Lvl: <7 mg/dL — ABNORMAL LOW (ref 7.0–30.0)

## 2020-06-08 LAB — PREGNANCY, URINE: Preg Test, Ur: NEGATIVE

## 2020-06-08 MED ORDER — HYDROXYZINE HCL 25 MG PO TABS
25.0000 mg | ORAL_TABLET | Freq: Three times a day (TID) | ORAL | Status: DC | PRN
  Administered 2020-06-08: 25 mg via ORAL
  Filled 2020-06-08 (×4): qty 1

## 2020-06-08 MED ORDER — ZIPRASIDONE MESYLATE 20 MG IM SOLR
10.0000 mg | Freq: Once | INTRAMUSCULAR | Status: AC
  Administered 2020-06-08: 10 mg via INTRAMUSCULAR
  Filled 2020-06-08: qty 20

## 2020-06-08 MED ORDER — NICOTINE 14 MG/24HR TD PT24
14.0000 mg | MEDICATED_PATCH | Freq: Once | TRANSDERMAL | Status: AC
  Administered 2020-06-08: 14 mg via TRANSDERMAL
  Filled 2020-06-08: qty 1

## 2020-06-08 MED ORDER — ZIPRASIDONE MESYLATE 20 MG IM SOLR
INTRAMUSCULAR | Status: AC
  Administered 2020-06-08: 10 mg via INTRAMUSCULAR
  Filled 2020-06-08: qty 20

## 2020-06-08 MED ORDER — ZIPRASIDONE MESYLATE 20 MG IM SOLR: 10.0000 mg | Freq: Once | INTRAMUSCULAR | Status: AC

## 2020-06-08 MED ORDER — OLANZAPINE 5 MG PO TABS: 5.0000 mg | ORAL_TABLET | Freq: Every day | ORAL | Status: DC

## 2020-06-08 MED ORDER — STERILE WATER FOR INJECTION IJ SOLN
INTRAMUSCULAR | Status: AC
  Administered 2020-06-08: 1.2 mL
  Filled 2020-06-08: qty 10

## 2020-06-08 NOTE — ED Notes (Signed)
Pt has one bookbag and a blue bag of belongings placed in two separate lockers
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
## 2020-06-08 NOTE — ED Notes (Signed)
Pt was offered a shower after she asked and stated " I do not feel comfortable taking my clothes off in the hospital"

## 2020-06-08 NOTE — ED Notes (Signed)
Pt mother and brother in to visit pt   Pt shouting and agitated before the visit   Calmed for a few moments   Mother and brother looked in on pt, then left   Pt then became agitated and loud   Pacing accusatory of staff and could not be redirected into room   Security presence encouraged pt to return to room
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
## 2020-06-08 NOTE — ED Notes (Signed)
Pt continues to be extremely agitated   Walking around room   Ripping syrofoam cups apart and accusing staff of not doing anything for her and demanding to leave   She does not choose to alter her behaviors  She cannot be redirected   Call to Dr Estell Harpin who will order med that was successful in calming pt this am without sedation

## 2020-06-08 NOTE — ED Notes (Signed)
A bit of a rebellion regarding supper tonight  Dinner chicken tenders chips and beverage    Pt would not eat

## 2020-06-08 NOTE — ED Notes (Signed)
Pt is impulsive and is calm, cooperative , then in hall shouting

## 2020-06-08 NOTE — BH Assessment (Signed)
Behavioral Health Note  TTS attempted to assess patient, but she is unwilling to participate in assessment
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
## 2020-06-08 NOTE — ED Notes (Signed)
Pt out in the hall yelling for her mother   Returned to room when redirected

## 2020-06-08 NOTE — ED Triage Notes (Signed)
Pt here with [employer]. [employer] states pt is paranoid and having erratic behaviors.

## 2020-06-08 NOTE — ED Notes (Signed)
Pt out and asked for the phone   mood suddenly changed when she heard another nurse (pt mood is and has been labile)  She is initially denied the phone by sitter, then given the phone by this RN reminding her that this is inappropriate behavior   Pt is trying to call her brother
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
## 2020-06-08 NOTE — ED Notes (Signed)
Pt refusing to have blood drawn at this time. MD notified.

## 2020-06-08 NOTE — ED Notes (Signed)
Pts mother came to check on pt. States she has had a long battle with illegal drugs, esp. Meth. Per mother she has been begging pt to get help with drugs. Mother concerned about pts continued behaviors and safety due to not thinking clearly.

## 2020-06-08 NOTE — ED Notes (Signed)
Pt asked if she could speak w nurse "just talk to me" Spoke regarding her love of animals, her cats, she wanted to be a vet when she was little   She is reminded that she is courageous and has an opportunity to be better  Pt asked for a hug and was given a quick hug  Currently quiet and resting
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
## 2020-06-08 NOTE — ED Notes (Signed)
Mother has called and is interested in visiting pt   [phone]

## 2020-06-08 NOTE — ED Notes (Signed)
Received report on pt, pt resting, no distress noted, sitter remains at bedside,  
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
## 2020-06-08 NOTE — Progress Notes (Addendum)
Patient meets criteria for inpatient treatment. No appropriate or available beds at [facility] CSW faxed referrals to the following facilities for review:  [facility]   [facility]   [facility]   [facility]   [facility]   [facility]   [facility]   [facility]   [facility]   [facility]   TTS will continue to seek bed placement.  Vilma Meckel. Algis Greenhouse, MSW, LCSW Clinical Social Work/Disposition Phone: [phone] Fax: [phone]

## 2020-06-08 NOTE — ED Notes (Signed)
Sitting in corner   Asked to return to bed that she might be medicated   Pt asks if this N is nervious - she is assured that the nurse has done this before

## 2020-06-09 MED ORDER — OLANZAPINE 5 MG PO TABS
10.0000 mg | ORAL_TABLET | Freq: Every day | ORAL | Status: DC
  Filled 2020-06-09: qty 2

## 2020-06-09 MED ORDER — OLANZAPINE 5 MG PO TBDP
5.0000 mg | ORAL_TABLET | Freq: Three times a day (TID) | ORAL | Status: DC | PRN
  Administered 2020-06-09 – 2020-06-10 (×2): 5 mg via ORAL
  Filled 2020-06-09 (×3): qty 1

## 2020-06-09 MED ORDER — ZIPRASIDONE MESYLATE 20 MG IM SOLR
10.0000 mg | Freq: Four times a day (QID) | INTRAMUSCULAR | Status: DC | PRN
  Administered 2020-06-09 (×2): 10 mg via INTRAMUSCULAR
  Filled 2020-06-09 (×2): qty 20

## 2020-06-09 MED ORDER — STERILE WATER FOR INJECTION IJ SOLN
INTRAMUSCULAR | Status: AC
  Filled 2020-06-09: qty 10

## 2020-06-09 NOTE — ED Notes (Signed)
Pt awake asking for snack graham crackers and peanut-butter given, jello
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
## 2020-06-09 NOTE — ED Notes (Signed)
Patient refused vitals.
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
## 2020-06-09 NOTE — Progress Notes (Signed)
Pt refused morning vital signs citing lack of sleep due to loud patient in the hallway.

## 2020-06-09 NOTE — ED Notes (Signed)
Patient was given a meal try for lunch.
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
## 2020-06-09 NOTE — ED Notes (Signed)
Pt refused her night meds stating she does not trust me to give them to her.
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
## 2020-06-10 ENCOUNTER — Inpatient Hospital Stay (HOSPITAL_COMMUNITY)
Admission: AD | Admit: 2020-06-10 | Discharge: 2020-06-18 | DRG: 885 | Disposition: A | Discharge status: Home / Self Care | Payer: Federal, State, Local not specified - Other | Source: Intra-hospital | Attending: Psychiatry | Admitting: Psychiatry

## 2020-06-10 ENCOUNTER — Other Ambulatory Visit: Payer: Self-pay | Admitting: Psychiatric/Mental Health

## 2020-06-10 ENCOUNTER — Inpatient Hospital Stay (HOSPITAL_COMMUNITY)
Admission: AD | Admit: 2020-06-10 | Payer: Federal, State, Local not specified - Other | Source: Intra-hospital | Admitting: Psychiatry

## 2020-06-10 DIAGNOSIS — F3113 Bipolar disorder, current episode manic without psychotic features, severe: Secondary | ICD-10-CM | POA: Diagnosis present

## 2020-06-10 DIAGNOSIS — F22 Delusional disorders: Secondary | ICD-10-CM | POA: Diagnosis present

## 2020-06-10 DIAGNOSIS — F3160 Bipolar disorder, current episode mixed, unspecified: Principal | ICD-10-CM | POA: Diagnosis present

## 2020-06-10 DIAGNOSIS — Z880 Allergy status to penicillin: Secondary | ICD-10-CM | POA: Diagnosis not present

## 2020-06-10 DIAGNOSIS — G4700 Insomnia, unspecified: Secondary | ICD-10-CM | POA: Diagnosis present

## 2020-06-10 DIAGNOSIS — F1910 Other psychoactive substance abuse, uncomplicated: Secondary | ICD-10-CM | POA: Diagnosis present

## 2020-06-10 DIAGNOSIS — F319 Bipolar disorder, unspecified: Secondary | ICD-10-CM | POA: Diagnosis present

## 2020-06-10 DIAGNOSIS — Z20822 Contact with and (suspected) exposure to covid-19: Secondary | ICD-10-CM | POA: Diagnosis present

## 2020-06-10 DIAGNOSIS — Z79899 Other long term (current) drug therapy: Secondary | ICD-10-CM

## 2020-06-10 DIAGNOSIS — F17210 Nicotine dependence, cigarettes, uncomplicated: Secondary | ICD-10-CM | POA: Diagnosis present

## 2020-06-10 DIAGNOSIS — R45851 Suicidal ideations: Secondary | ICD-10-CM | POA: Diagnosis present

## 2020-06-10 DIAGNOSIS — F4310 Post-traumatic stress disorder, unspecified: Secondary | ICD-10-CM | POA: Diagnosis present

## 2020-06-10 LAB — SARS CORONAVIRUS 2 BY RT PCR (HOSPITAL ORDER, PERFORMED IN ~~LOC~~ HOSPITAL LAB): SARS Coronavirus 2: NEGATIVE

## 2020-06-10 MED ORDER — ZIPRASIDONE MESYLATE 20 MG IM SOLR: 10.0000 mg | Freq: Two times a day (BID) | INTRAMUSCULAR | Status: DC | PRN

## 2020-06-10 MED ORDER — HYDROXYZINE HCL 25 MG PO TABS
25.0000 mg | ORAL_TABLET | Freq: Three times a day (TID) | ORAL | Status: DC | PRN
  Administered 2020-06-13 – 2020-06-17 (×5): 25 mg via ORAL
  Filled 2020-06-10: qty 10
  Filled 2020-06-10 (×6): qty 1

## 2020-06-10 MED ORDER — OLANZAPINE 10 MG PO TBDP
10.0000 mg | ORAL_TABLET | Freq: Every day | ORAL | Status: DC
  Administered 2020-06-10 – 2020-06-11 (×2): 10 mg via ORAL
  Filled 2020-06-10 (×4): qty 1

## 2020-06-10 MED ORDER — ACETAMINOPHEN 325 MG PO TABS
650.0000 mg | ORAL_TABLET | Freq: Four times a day (QID) | ORAL | Status: DC | PRN
  Administered 2020-06-11 – 2020-06-14 (×2): 650 mg via ORAL
  Filled 2020-06-10 (×3): qty 2

## 2020-06-10 MED ORDER — OLANZAPINE 5 MG PO TBDP: 5.0000 mg | ORAL_TABLET | Freq: Three times a day (TID) | ORAL | Status: DC | PRN

## 2020-06-10 MED ORDER — TRAZODONE HCL 50 MG PO TABS
50.0000 mg | ORAL_TABLET | Freq: Every evening | ORAL | Status: DC | PRN
  Administered 2020-06-13 – 2020-06-17 (×4): 50 mg via ORAL
  Filled 2020-06-10 (×3): qty 1
  Filled 2020-06-10: qty 7
  Filled 2020-06-10: qty 1

## 2020-06-10 MED ORDER — ALUM & MAG HYDROXIDE-SIMETH 200-200-20 MG/5ML PO SUSP: 30.0000 mL | ORAL | Status: DC | PRN

## 2020-06-10 MED ORDER — MAGNESIUM HYDROXIDE 400 MG/5ML PO SUSP: 30.0000 mL | Freq: Every day | ORAL | Status: DC | PRN

## 2020-06-10 NOTE — ED Notes (Signed)
Pt. Refused to take Vistaril. Pt. Wanted the Zyprexa. Pt. Also wishes for the med to be given to them in the packaging. Pt. States they do not trust the staff and that meds have been switched out before. Pt. Also wanted an update about if they were going home. I updated pt. About [facility] calling. Pt. Stated they wanted to go home not to [facility].

## 2020-06-10 NOTE — ED Notes (Signed)
Pt is refusing [place] to get her vitals.

## 2020-06-10 NOTE — ED Notes (Addendum)
Pt is refusing for [place] to get vitals. Pt asked to be left alone at this time.

## 2020-06-10 NOTE — ED Notes (Signed)
Pt yelling due to another psych pt yelling. I informed her that if she kept yelling I would have to give her another dose of geodon.
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
## 2020-06-10 NOTE — Progress Notes (Signed)
Pt meets inpatient criteria. Referral information has been sent to the following hospitals for review:   [facility]  [facility]  [facility]  [facility]  [facility]  [facility]  [facility]  [facility]  [facility]     Disposition will continue to assist with inpatient placement needs.   Wells Guiles, LCSW, LCAS Disposition CSW [facility] [phone]

## 2020-06-11 ENCOUNTER — Encounter (HOSPITAL_COMMUNITY): Payer: Self-pay | Admitting: Psychiatric/Mental Health

## 2020-06-11 ENCOUNTER — Other Ambulatory Visit: Payer: Self-pay

## 2020-06-11 DIAGNOSIS — F319 Bipolar disorder, unspecified: Secondary | ICD-10-CM

## 2020-06-11 MED ORDER — POTASSIUM CHLORIDE CRYS ER 20 MEQ PO TBCR
20.0000 meq | EXTENDED_RELEASE_TABLET | Freq: Once | ORAL | Status: AC
  Administered 2020-06-11: 20 meq via ORAL
  Filled 2020-06-11: qty 1

## 2020-06-11 MED ORDER — NICOTINE POLACRILEX 2 MG MT GUM
2.0000 mg | CHEWING_GUM | OROMUCOSAL | Status: DC | PRN
  Administered 2020-06-11 – 2020-06-17 (×11): 2 mg via ORAL
  Filled 2020-06-11 (×4): qty 1

## 2020-06-11 NOTE — H&P (Signed)
Psychiatric Admission Assessment Adult  Patient Identification: Carol Miles MRN:  [id number] Date of Evaluation:  06/11/2020 Chief Complaint:  Bipolar affective disorder, currently manic, severe, with psychotic features (HCC) [F31.2] Principal Diagnosis: <principal problem not specified> Diagnosis:  Active Problems:   Affective psychosis, bipolar (HCC)  History of Present Illness: Patient is seen and examined.  Patient is a 29 year old female with a past psychiatric history significant for bipolar disorder, polysubstance use disorders as well as posttraumatic stress disorder who was brought to the [facility] emergency department on 06/08/2020 by police.  There apparently is a Conservator, museum/gallerydeputy who works in the area who sees the patient on a fairly regular basis.  The progress notes from [facility] stated that the deputy had last seen the patient on 06/02/2020 and the patient was fine at that time.  Reportedly she had had an argument with someone on the night of admission, and she was found out in the middle of the street.  She was noted to be angry and would not provide a great deal history.  She denied suicidal ideation, and was not noted to be psychotic.  She was transferred to our facility for evaluation and stabilization.  This morning the patient stated that she had stopped taking her medications directly after discharge.  She stated she had been seen at [facility], and was told to follow-up there.  She stated that she had gotten her prescriptions at a pharmacy, and they had "warned me about the side effects".  She is not a good historian at this point.  Her drug screen on admission at [facility] was negative for any substances.  Her last admission to our facility was on 05/10/2020.  She was admitted at that time for suicidal ideation.  She had had a previous psychiatric admission at [facility] on [facility].  She was treated with lithium and Zyprexa at that time.  Her drug screen on admission at that time had  marijuana as well as amphetamines.  Patient stated she had not used amphetamines in approximately 2 weeks.  She was admitted to the hospital for evaluation and stabilization.  Associated Signs/Symptoms: Depression Symptoms:  anhedonia, insomnia, anxiety, loss of energy/fatigue, disturbed sleep, weight loss, (Hypo) Manic Symptoms:  Impulsivity, Irritable Mood, Labiality of Mood, Anxiety Symptoms:  Excessive Worry, Psychotic Symptoms:  Denied PTSD Symptoms: Had a traumatic exposure:  In the past Total Time spent with patient: 30 minutes  Past Psychiatric History: Patient has a history of posttraumatic stress disorder, bipolar disorder as well as polysubstance abuse disorders.  Her last psychiatric hospitalization from our facility was on 05/09/2020.  Prior to that she was hospitalized at [facility] in [facility].  Her diagnosis at that time was bipolar disorder mixed and polysubstance abuse including methamphetamines, benzodiazepines and cannabis.  Is the patient at risk to self? No.  Has the patient been a risk to self in the past 6 months? Yes.    Has the patient been a risk to self within the distant past? Yes.    Is the patient a risk to others? No.  Has the patient been a risk to others in the past 6 months? No.  Has the patient been a risk to others within the distant past? No.   Prior Inpatient Therapy:   Prior Outpatient Therapy:    Alcohol Screening: 1. How often do you have a drink containing alcohol?: Monthly or less 2. How many drinks containing alcohol do you have on a typical day when you  are drinking?: 3 or 4 3. How often do you have six or more drinks on one occasion?: Less than monthly AUDIT-C Score: 3 4. How often during the last year have you found that you were not able to stop drinking once you had started?: Never 5. How often during the last year have you failed to do what was normally expected from you because of drinking?: Never 6. How often  during the last year have you needed a first drink in the morning to get yourself going after a heavy drinking session?: Never 7. How often during the last year have you had a feeling of guilt of remorse after drinking?: Never 8. How often during the last year have you been unable to remember what happened the night before because you had been drinking?: Never 9. Have you or someone else been injured as a result of your drinking?: No 10. Has a relative or friend or a doctor or another health worker been concerned about your drinking or suggested you cut down?: No Alcohol Use Disorder Identification Test Final Score (AUDIT): 3 Alcohol Brief Interventions/Follow-up: AUDIT Score <7 follow-up not indicated Substance Abuse History in the last 12 months:  Yes.   Consequences of Substance Abuse: Medical Consequences:  Clearly impacted recent hospitalizations, and is well looking through the electronic medical record multiple emergency room visits secondary to substance abuse issues Previous Psychotropic Medications: Yes  Psychological Evaluations: Yes  Past Medical History:  Past Medical History:  Diagnosis Date  . Bipolar 1 disorder (HCC)   . Depression   . HPV (human papilloma virus) anogenital infection   . PTSD (post-traumatic stress disorder)     Past Surgical History:  Procedure Laterality Date  . HERNIA REPAIR     Family History:  Family History  Problem Relation Age of Onset  . OCD Brother    Family Psychiatric  History: According to patient maternal grandmother had schizophrenia, and brother has obsessive-compulsive disorder. Tobacco Screening: Have you used any form of tobacco in the last 30 days? (Cigarettes, Smokeless Tobacco, Cigars, and/or Pipes): Yes Tobacco use, Select all that apply: 5 or more cigarettes per day Are you interested in Tobacco Cessation Medications?: Yes, will notify MD for an order Counseled patient on smoking cessation including recognizing danger  situations, developing coping skills and basic information about quitting provided: Yes Social History:  Social History   Substance and Sexual Activity  Alcohol Use Yes   Comment: occ has a beer      Social History   Substance and Sexual Activity  Drug Use Yes  . Types: Methamphetamines, Marijuana   Comment: heroin    Additional Social History:      Pain Medications: See MAR Prescriptions: See MAR Over the Counter: See MAR History of alcohol / drug use?: Yes Negative Consequences of Use: Personal relationships, Financial, Work / School                    Allergies:   Allergies  Allergen Reactions  . Amoxicillin Hives  . Banana   . Penicillins Hives    Childhood allergy-patient remembers taking Amoxicillin with no side effects. Did it involve swelling of the face/tongue/throat, SOB, or low BP? No Did it involve sudden or severe rash/hives, skin peeling, or any reaction on the inside of your mouth or nose? No Did you need to seek medical attention at a hospital or doctor's office? Unknown When did it last happen?childhood If all above answers are "NO", may proceed  with cephalosporin use.   . Sulfa Antibiotics Nausea And Vomiting   Lab Results:  Results for orders placed or performed during the hospital encounter of 06/08/20 (from the past 48 hour(s))  SARS Coronavirus 2 by RT PCR (hospital order, performed in [facility] hospital lab) Nasopharyngeal Nasopharyngeal Swab     Status: None   Collection Time: 06/10/20  5:46 PM   Specimen: Nasopharyngeal Swab  Result Value Ref Range   SARS Coronavirus 2 NEGATIVE NEGATIVE    Comment: (NOTE) SARS-CoV-2 target nucleic acids are NOT DETECTED.  The SARS-CoV-2 RNA is generally detectable in upper and lower respiratory specimens during the acute phase of infection. The lowest concentration of SARS-CoV-2 viral copies this assay can detect is 250 copies / mL. A negative result does not preclude SARS-CoV-2  infection and should not be used as the sole basis for treatment or other patient management decisions.  A negative result may occur with improper specimen collection / handling, submission of specimen other than nasopharyngeal swab, presence of viral mutation(s) within the areas targeted by this assay, and inadequate number of viral copies (<250 copies / mL). A negative result must be combined with clinical observations, patient history, and epidemiological information.  Fact Sheet for Patients:   BoilerBrush.com.cy  Fact Sheet for Healthcare Providers: https://pope.com/  This test is not yet approved or  cleared by the [place] FDA and has been authorized for detection and/or diagnosis of SARS-CoV-2 by FDA under an Emergency Use Authorization (EUA).  This EUA will remain in effect (meaning this test can be used) for the duration of the COVID-19 declaration under Section 564(b)(1) of the Act, 21 U.S.C. section 360bbb-3(b)(1), unless the authorization is terminated or revoked sooner.  Performed at [facility], [address] [place], [place] [address]     Blood Alcohol level:  Lab Results  Component Value Date   [place] [facility] <10 06/08/2020   ETH <10 05/06/2020    Metabolic Disorder Labs:  Lab Results  Component Value Date   HGBA1C 4.9 05/10/2020   MPG 93.93 05/10/2020   No results found for: PROLACTIN Lab Results  Component Value Date   CHOL 149 05/10/2020   TRIG 78 05/10/2020   HDL 47 05/10/2020   CHOLHDL 3.2 05/10/2020   VLDL 16 05/10/2020   LDLCALC 86 05/10/2020    Current Medications: Current Facility-Administered Medications  Medication Dose Route Frequency Provider Last Rate Last Admin  . acetaminophen (TYLENOL) tablet 650 mg  650 mg Oral Q6H PRN Aldean Baker, NP      . alum & mag hydroxide-simeth (MAALOX/MYLANTA) 200-200-20 MG/5ML suspension 30 mL  30 mL Oral Q4H PRN Aldean Baker, NP      . hydrOXYzine  (ATARAX/VISTARIL) tablet 25 mg  25 mg Oral TID PRN Aldean Baker, NP      . magnesium hydroxide (MILK OF MAGNESIA) suspension 30 mL  30 mL Oral Daily PRN Aldean Baker, NP      . OLANZapine zydis (ZYPREXA) disintegrating tablet 10 mg  10 mg Oral QHS Aldean Baker, NP   10 mg at 06/10/20 2248  . traZODone (DESYREL) tablet 50 mg  50 mg Oral QHS PRN Aldean Baker, NP      . ziprasidone (GEODON) injection 10 mg  10 mg Intramuscular Q12H PRN Aldean Baker, NP       PTA Medications: Medications Prior to Admission  Medication Sig Dispense Refill Last Dose  . hydrOXYzine (ATARAX/VISTARIL) 25 MG tablet Take 1 tablet (25  mg total) by mouth 3 (three) times daily as needed for anxiety. (Patient not taking: Reported on 06/08/2020) 60 tablet 0   . OLANZapine (ZYPREXA) 5 MG tablet Take 5 mg by mouth at bedtime.  (Patient not taking: Reported on 06/08/2020)       Musculoskeletal: Strength & Muscle Tone: within normal limits Gait & Station: normal Patient leans: N/A  Psychiatric Specialty Exam: Physical Exam Vitals and nursing note reviewed.  HENT:     Head: Normocephalic and atraumatic.  Pulmonary:     Effort: Pulmonary effort is normal.  Neurological:     General: No focal deficit present.     Mental Status: She is alert.     Review of Systems  Blood pressure 110/71, pulse 99, temperature 98.3 F (36.8 C), temperature source Oral, resp. rate 18, height 5\' 7"  (1.702 m), weight 59.9 kg, last menstrual period 05/29/2020, SpO2 98 %.Body mass index is 20.67 kg/m.  General Appearance: Disheveled  Eye Contact:  Minimal  Speech:  Normal Rate  Volume:  Decreased  Mood:  Dysphoric  Affect:  Congruent  Thought Process:  Coherent and Descriptions of Associations: Circumstantial  Orientation:  Full (Time, Place, and Person)  Thought Content:  Logical  Suicidal Thoughts:  No  Homicidal Thoughts:  No  Memory:  Immediate;   Poor Recent;   Poor Remote;   Poor  Judgement:  Impaired  Insight:   Lacking  Psychomotor Activity:  Decreased  Concentration:  Concentration: Poor and Attention Span: Poor  Recall:  Poor  Fund of Knowledge:  Fair  Language:  Fair  Akathisia:  Negative  Handed:  Right  AIMS (if indicated):     Assets:  Desire for Improvement Resilience  ADL's:  Intact  Cognition:  WNL  Sleep:       Treatment Plan Summary: Daily contact with patient to assess and evaluate symptoms and progress in treatment, Medication management and Plan : Patient is seen and examined.  Patient is a 29 year old female with the above-stated past psychiatric history who is admitted secondary to confusion, agitation and irritability.  She apparently has been noncompliant with her discharge medications from her last hospitalization in July of this year.  She will be admitted to the hospital.  She will be integrated in the milieu.  She will be encouraged to attend groups.  It does sound like she has been significantly noncompliant with medications.  On discharge from the July hospitalization she was on olanzapine 5 mg p.o. nightly and hydroxyzine 25 mg p.o. 3 times daily as needed.  We will restart the olanzapine.  We will give her 5 mg this a.m., and start olanzapine 10 mg p.o. nightly.  She will also have available to be hydroxyzine as well as trazodone.  Review of her admission laboratories revealed a mildly low potassium at 3.1, and that will be supplemented.  Liver function enzymes and the rest of her electrolytes were normal.  CBC was normal.  Acetaminophen was less than 10, salicylate was less than 7.  Beta-hCG was negative.  Urinalysis was negative.  Blood alcohol was less than 10, drug screen was negative.  Her vital signs are stable, she is afebrile.  Observation Level/Precautions:  15 minute checks  Laboratory:  Chemistry Profile  Psychotherapy:    Medications:    Consultations:    Discharge Concerns:    Estimated LOS:  Other:     Physician Treatment Plan for Primary Diagnosis:  <principal problem not specified> Long Term Goal(s): Improvement in symptoms so  as ready for discharge  Short Term Goals: Ability to identify changes in lifestyle to reduce recurrence of condition will improve, Ability to verbalize feelings will improve, Ability to disclose and discuss suicidal ideas, Ability to demonstrate self-control will improve, Ability to identify and develop effective coping behaviors will improve, Ability to maintain clinical measurements within normal limits will improve, Compliance with prescribed medications will improve and Ability to identify triggers associated with substance abuse/mental health issues will improve  Physician Treatment Plan for Secondary Diagnosis: Active Problems:   Affective psychosis, bipolar (HCC)  Long Term Goal(s): Improvement in symptoms so as ready for discharge  Short Term Goals: Ability to identify changes in lifestyle to reduce recurrence of condition will improve, Ability to verbalize feelings will improve, Ability to disclose and discuss suicidal ideas, Ability to demonstrate self-control will improve, Ability to identify and develop effective coping behaviors will improve, Ability to maintain clinical measurements within normal limits will improve, Compliance with prescribed medications will improve and Ability to identify triggers associated with substance abuse/mental health issues will improve  I certify that inpatient services furnished can reasonably be expected to improve the patient's condition.    Antonieta Pert, MD 8/25/20213:09 PM
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
## 2020-06-11 NOTE — Tx Team (Signed)
Interdisciplinary Treatment and Diagnostic Plan Update  06/11/2020 Time of Session: 2:15PM Carol Miles MRN: [id number]  Principal Diagnosis: <principal problem not specified>  Secondary Diagnoses: Active Problems:   Affective psychosis, bipolar ([place])   Current Medications:  Current Facility-Administered Medications  Medication Dose Route Frequency Provider Last Rate Last Admin  . acetaminophen (TYLENOL) tablet 650 mg  650 mg Oral Q6H PRN Connye Burkitt, NP      . alum & mag hydroxide-simeth (MAALOX/MYLANTA) 200-200-20 MG/5ML suspension 30 mL  30 mL Oral Q4H PRN Connye Burkitt, NP      . hydrOXYzine (ATARAX/VISTARIL) tablet 25 mg  25 mg Oral TID PRN Connye Burkitt, NP      . magnesium hydroxide (MILK OF MAGNESIA) suspension 30 mL  30 mL Oral Daily PRN Connye Burkitt, NP      . OLANZapine zydis (ZYPREXA) disintegrating tablet 10 mg  10 mg Oral QHS Connye Burkitt, NP   10 mg at 06/10/20 2248  . traZODone (DESYREL) tablet 50 mg  50 mg Oral QHS PRN Connye Burkitt, NP      . ziprasidone (GEODON) injection 10 mg  10 mg Intramuscular Q12H PRN Connye Burkitt, NP       PTA Medications: Medications Prior to Admission  Medication Sig Dispense Refill Last Dose  . hydrOXYzine (ATARAX/VISTARIL) 25 MG tablet Take 1 tablet (25 mg total) by mouth 3 (three) times daily as needed for anxiety. (Patient not taking: Reported on 06/08/2020) 60 tablet 0   . OLANZapine (ZYPREXA) 5 MG tablet Take 5 mg by mouth at bedtime.  (Patient not taking: Reported on 06/08/2020)       Patient Stressors: Financial difficulties Marital or family conflict Occupational concerns Substance abuse  Patient Strengths: Armed forces logistics/support/administrative officer Supportive family/friends  Treatment Modalities: Medication Management, Group therapy, Case management,  1 to 1 session with clinician, Psychoeducation, Recreational therapy.   Physician Treatment Plan for Primary Diagnosis: <principal problem not specified> Long Term Goal(s):      Short Term Goals:    Medication Management: Evaluate patient's response, side effects, and tolerance of medication regimen.  Therapeutic Interventions: 1 to 1 sessions, Unit Group sessions and Medication administration.  Evaluation of Outcomes: Not Met  Physician Treatment Plan for Secondary Diagnosis: Active Problems:   Affective psychosis, bipolar ([place])  Long Term Goal(s):     Short Term Goals:       Medication Management: Evaluate patient's response, side effects, and tolerance of medication regimen.  Therapeutic Interventions: 1 to 1 sessions, Unit Group sessions and Medication administration.  Evaluation of Outcomes: Not Met   RN Treatment Plan for Primary Diagnosis: <principal problem not specified> Long Term Goal(s): Knowledge of disease and therapeutic regimen to maintain health will improve  Short Term Goals: Ability to remain free from injury will improve, Ability to verbalize frustration and anger appropriately will improve, Ability to demonstrate self-control, Ability to identify and develop effective coping behaviors will improve and Compliance with prescribed medications will improve  Medication Management: RN will administer medications as ordered by provider, will assess and evaluate patient's response and provide education to patient for prescribed medication. RN will report any adverse and/or side effects to prescribing provider.  Therapeutic Interventions: 1 on 1 counseling sessions, Psychoeducation, Medication administration, Evaluate responses to treatment, Monitor vital signs and CBGs as ordered, Perform/monitor CIWA, COWS, AIMS and Fall Risk screenings as ordered, Perform wound care treatments as ordered.  Evaluation of Outcomes: Not Met   LCSW Treatment Plan for Primary  Diagnosis: <principal problem not specified> Long Term Goal(s): Safe transition to appropriate next level of care at discharge, Engage patient in therapeutic group addressing  interpersonal concerns.  Short Term Goals: Engage patient in aftercare planning with referrals and resources, Increase social support, Increase ability to appropriately verbalize feelings, Identify triggers associated with mental health/substance abuse issues and Increase skills for wellness and recovery  Therapeutic Interventions: Assess for all discharge needs, 1 to 1 time with Social worker, Explore available resources and support systems, Assess for adequacy in community support network, Educate family and significant other(s) on suicide prevention, Complete Psychosocial Assessment, Interpersonal group therapy.  Evaluation of Outcomes: Not Met   Progress in Treatment: Attending groups: No. Participating in groups: No. Taking medication as prescribed: Yes. Toleration medication: Yes. Family/Significant other contact made: No, will contact:  if consent is given Patient understands diagnosis: Yes. Discussing patient identified problems/goals with staff: Yes. Medical problems stabilized or resolved: Yes. Denies suicidal/homicidal ideation: Yes. Issues/concerns per patient self-inventory: No.   New problem(s) identified: No, Describe:  none.  New Short Term/Long Term Goal(s): medication stabilization, elimination of SI thoughts, development of comprehensive mental wellness plan.   Patient Goals:  Patient did not attend   Discharge Plan or Barriers: Patient recently admitted. CSW will continue to follow and assess for appropriate referrals and possible discharge planning.   Reason for Continuation of Hospitalization: Aggression Delusions  Hallucinations Medication stabilization  Estimated Length of Stay: 3-5 days  Attendees: Patient: Pt did not attend  06/11/2020   Physician:  06/11/2020   Nursing:  06/11/2020  RN Care Manager: 06/11/2020  Social Worker: Darletta Moll, LCSW 06/11/2020   Recreational Therapist:  06/11/2020  Other:  06/11/2020   Other:  06/11/2020  Other:  06/11/2020       Scribe for Treatment Team: Vassie Moselle, LCSW 06/11/2020 2:30 PM
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
## 2020-06-12 MED ORDER — LITHIUM CARBONATE ER 300 MG PO TBCR
300.0000 mg | EXTENDED_RELEASE_TABLET | Freq: Two times a day (BID) | ORAL | Status: DC
  Administered 2020-06-12 – 2020-06-13 (×3): 300 mg via ORAL
  Filled 2020-06-12 (×8): qty 1

## 2020-06-12 MED ORDER — OLANZAPINE 5 MG PO TBDP
15.0000 mg | ORAL_TABLET | Freq: Every day | ORAL | Status: DC
  Administered 2020-06-12 – 2020-06-13 (×2): 15 mg via ORAL
  Filled 2020-06-12 (×5): qty 3
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
## 2020-06-12 NOTE — Progress Notes (Signed)
Recreation Therapy Notes  INPATIENT RECREATION THERAPY ASSESSMENT  Patient Details Name: Carol Miles MRN: [id number] DOB: Oct 24, 1990 Today's Date: 06/12/2020       Information Obtained From: Chart Review  Reason for Admission (Per Patient): Substance Abuse (Psychosis)  Patient Stressors: Other (Comment) (Housing; Actuary)  Coping Skills:   Substance Abuse, Prayer  Leisure Interests (2+):  Nature - Hiking, Technical brewer - [employer], Sports - Other (Comment)  [place] of Residence:  [place]  Patient Main Form of Transportation:    Patient Strengths:  Open minded; Inquisistive  Patient Identified Areas of Improvement:  None  Patient Goal for Hospitalization:  None identified  Staff Intervention Plan: Group Attendance, Collaborate with Interdisciplinary Treatment Team  Consent to Intern Participation: N/A    Caroll Rancher, LRT/CTRS  Lillia Abed, Dimonique Bourdeau A 06/12/2020, 1:17 PM

## 2020-06-12 NOTE — [facility] Suicide Risk Assessment (Signed)
[facility] INPATIENT:  Family/Significant Other Suicide Prevention Education    Refusal of Consents:  Suicide Prevention Education:  Patient Refusal for Family/Significant Other Suicide Prevention Education: The patient Dian Laprade, has refused to provide written consent for family/significant other to be provided Family/Significant Other Suicide Prevention Education during admission and/or prior to discharge.  Physician notified.  SPE pamphlet placed on chart for patient to share with supports at discharge.    Ruthann Cancer MSW, LCSW Clincal Social Worker  [facility]
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
## 2020-06-12 NOTE — Progress Notes (Signed)
Recreation Therapy Notes  Date: 8.26.21 Time: 0955 Location: [address] Dayroom  Group Topic: Self-Esteem  Goal Area(s) Addresses:  Patient will successfully identify positive attributes about themselves.  Patient will successfully identify benefit of improved self-esteem.   Intervention: Blank face sheet, colored pencils, markers  Activity: Mask Project.  Patients were to take a blank out line of a face and design it to show the positive attributes about themselves.  Patients could draw an actual face, use words or a combination of both to highlight what makes them unique and stand out from everyone else.  Education:  Self-Esteem, Building control surveyor.   Education Outcome: Acknowledges education/In group clarification offered/Needs additional education  Clinical Observations/Feedback: Pt did not attend group session.    Caroll Rancher, LRT/CTRS    Caroll Rancher A 06/12/2020 10:41 AM

## 2020-06-12 NOTE — Progress Notes (Signed)
   06/12/20 2114  Psych Admission Type (Psych Patients Only)  Admission Status Involuntary  Psychosocial Assessment  Patient Complaints Anger;Irritability;Anxiety  Eye Contact Fair  Facial Expression Angry;Animated;Anxious;Pensive  Affect Angry;Anxious;Apprehensive;Irritable;Preoccupied  Speech Argumentative;Logical/coherent;Pressured;Loud  Interaction Attention-seeking;Childlike;Dominating;Hostile  Motor Activity Fidgety  Appearance/Hygiene Unremarkable  Behavior Characteristics Cooperative;Anxious  Mood Anxious;Irritable  Thought Process  Coherency Disorganized  Content Blaming others  Delusions Controlled;Persecutory  Perception WDL  Hallucination None reported or observed  Judgment Limited  Confusion UTA  Danger to Self  Current suicidal ideation? Denies  Danger to Others  Danger to Others None reported or observed   Pt angry because her meds have been changed. Lithium was added tonight. "Why am I on lithium? You all are just shoving pills down my throat. Are you going to test my blood with lithium or are you just gonna let me die?!" Pt stormed off but came back in less than 2 minutes, politely asking for something to drink. When asked if she wanted something for mild pain in her foot, "You all are always pushing pills. God. You are worse than the drug dealers on the street."

## 2020-06-13 LAB — TSH: TSH: 1.005 u[IU]/mL (ref 0.350–4.500)

## 2020-06-13 MED ORDER — OLANZAPINE 10 MG PO TBDP
10.0000 mg | ORAL_TABLET | Freq: Two times a day (BID) | ORAL | Status: DC | PRN
  Administered 2020-06-14 – 2020-06-16 (×2): 10 mg via ORAL
  Filled 2020-06-13 (×2): qty 1

## 2020-06-13 MED ORDER — ZIPRASIDONE MESYLATE 20 MG IM SOLR
20.0000 mg | INTRAMUSCULAR | Status: AC | PRN
  Administered 2020-06-14: 20 mg via INTRAMUSCULAR
  Filled 2020-06-13: qty 20

## 2020-06-13 MED ORDER — LORAZEPAM 1 MG PO TABS
1.0000 mg | ORAL_TABLET | ORAL | Status: AC | PRN
  Administered 2020-06-14: 1 mg via ORAL
  Filled 2020-06-13: qty 1

## 2020-06-13 MED ORDER — CARBAMAZEPINE 100 MG PO CHEW
200.0000 mg | CHEWABLE_TABLET | Freq: Two times a day (BID) | ORAL | Status: DC
  Administered 2020-06-13: 200 mg via ORAL
  Filled 2020-06-13 (×4): qty 2
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
## 2020-06-13 NOTE — Progress Notes (Signed)
Patient presents angry and irritable with some paranoia. Pt is medication compliant but cursed the whole time while taking meds. Denies any SI, HI, AVH. Refused vs. Pt isolative to self and room. Did come out for snacks. Minimal interaction with staff and peers.  Encouragement and support provided. Safety checks maintained. Medications given as prescribed. Pt receptive and remains safe on unit with q 15 min checks.

## 2020-06-14 MED ORDER — CARBAMAZEPINE 100 MG PO CHEW
300.0000 mg | CHEWABLE_TABLET | Freq: Two times a day (BID) | ORAL | Status: DC
  Administered 2020-06-14 – 2020-06-17 (×6): 300 mg via ORAL
  Filled 2020-06-14 (×9): qty 3

## 2020-06-14 MED ORDER — OLANZAPINE 10 MG PO TBDP
20.0000 mg | ORAL_TABLET | Freq: Every day | ORAL | Status: DC
  Administered 2020-06-14: 20 mg via ORAL
  Filled 2020-06-14 (×2): qty 2

## 2020-06-14 NOTE — Progress Notes (Signed)
Adult Psychoeducational Group Note  Date:  06/14/2020 Time:  10:29 PM  Group Topic/Focus:  Wrap-Up Group:   The focus of this group is to help patients review their daily goal of treatment and discuss progress on daily workbooks.  Participation Level:  Minimal  Participation Quality:  Appropriate  Affect:  Anxious, Blunted, Defensive, Flat, Irritable, Labile and Lethargic  Cognitive:  Disorganized, Confused and Delusional  Insight: Lacking and Limited  Engagement in Group:  Lacking and Limited  Modes of Intervention:  Discussion  Additional Comments: Pt stated her goal for today was to focus on her treatment plan. Pt stated she felt she accomplished her goal today. Pt stated she talk with her doctor and social worker, regarding her care today. Pt stated been able to contact her mother today, improved her overall day. Pt stated she took all her medication today from her providers. Pt stated her appetite was pretty good today and she attended all meals. Pt stated she was in some physical pain. Pt could not tell writer where the pain was located but she rated it a 3. Pt deny auditory or visual hallucinations. Pt denies thoughts of harming herself or others. Pt stated she would alert staff if anything changes.   Felipa Furnace 06/14/2020, 10:29 PM
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
## 2020-06-14 NOTE — Progress Notes (Signed)
   06/14/20 2040  COVID-19 Daily Checkoff  Have you had a fever (temp > 37.80C/100F)  in the past 24 hours?  No  If you have had runny nose, nasal congestion, sneezing in the past 24 hours, has it worsened? No  COVID-19 EXPOSURE  Have you traveled outside the state in the past 14 days? No  Have you been in contact with someone with a confirmed diagnosis of COVID-19 or PUI in the past 14 days without wearing appropriate PPE? No  Have you been living in the same home as a person with confirmed diagnosis of COVID-19 or a PUI (household contact)? No  Have you been diagnosed with COVID-19? No

## 2020-06-14 NOTE — Progress Notes (Signed)
   06/14/20 2040  Psych Admission Type (Psych Patients Only)  Admission Status Involuntary  Psychosocial Assessment  Patient Complaints Agitation;Anxiety  Eye Contact Fair  Facial Expression Angry;Animated;Anxious;Pensive  Affect Angry;Anxious;Irritable;Preoccupied  Speech Aggressive;Argumentative;Logical/coherent  Interaction Arrogant;Assertive;Attention-seeking;Childlike  Motor Activity Fidgety;Restless  Appearance/Hygiene Unremarkable  Behavior Characteristics Appropriate to situation;Agitated;Anxious  Aggressive Behavior  Targets Family  Type of Behavior Verbal  Effect No apparent injury  Thought Process  Coherency Concrete thinking;Disorganized;Tangential  Content Blaming others;Confabulation  Delusions Controlled;Persecutory  Perception WDL  Hallucination None reported or observed  Judgment Limited  Confusion None  Danger to Self  Current suicidal ideation? Denies  Danger to Others  Danger to Others None reported or observed
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
## 2020-06-15 MED ORDER — FLUPHENAZINE HCL 5 MG PO TABS
5.0000 mg | ORAL_TABLET | Freq: Two times a day (BID) | ORAL | Status: DC
  Administered 2020-06-15 – 2020-06-16 (×3): 5 mg via ORAL
  Filled 2020-06-15 (×5): qty 1

## 2020-06-15 MED ORDER — LORAZEPAM 1 MG PO TABS
1.0000 mg | ORAL_TABLET | Freq: Four times a day (QID) | ORAL | Status: DC | PRN
  Administered 2020-06-16: 1 mg via ORAL
  Filled 2020-06-15: qty 1

## 2020-06-15 MED ORDER — ZIPRASIDONE MESYLATE 20 MG IM SOLR: 20.0000 mg | Freq: Four times a day (QID) | INTRAMUSCULAR | Status: DC | PRN

## 2020-06-15 MED ORDER — OLANZAPINE 5 MG PO TBDP
15.0000 mg | ORAL_TABLET | Freq: Every day | ORAL | Status: DC
  Filled 2020-06-15 (×2): qty 3

## 2020-06-15 MED ORDER — OLANZAPINE 10 MG PO TBDP
10.0000 mg | ORAL_TABLET | Freq: Every day | ORAL | Status: DC
  Administered 2020-06-15: 10 mg via ORAL
  Filled 2020-06-15 (×2): qty 1

## 2020-06-15 NOTE — Progress Notes (Signed)
   06/15/20 2050  Psych Admission Type (Psych Patients Only)  Admission Status Involuntary  Psychosocial Assessment  Patient Complaints Anxiety  Eye Contact Fair  Facial Expression Angry;Animated;Anxious;Pensive  Affect Angry;Anxious;Irritable;Preoccupied  Speech Aggressive;Argumentative;Logical/coherent  Interaction Arrogant;Assertive;Attention-seeking;Childlike  Motor Activity Fidgety;Restless  Appearance/Hygiene Unremarkable  Behavior Characteristics Anxious;Impulsive  Mood Anxious;Labile  Aggressive Behavior  Targets Family  Type of Behavior Verbal  Effect No apparent injury  Thought Process  Coherency Concrete thinking;Disorganized;Tangential  Content Blaming others;Confabulation  Delusions Controlled;Persecutory  Perception WDL  Hallucination None reported or observed  Judgment Limited  Confusion None  Danger to Self  Current suicidal ideation? Denies  Danger to Others  Danger to Others None reported or observed

## 2020-06-15 NOTE — [facility] Group Notes (Signed)
[facility] LCSW Group Therapy Note  Date/Time:  06/15/2020  11:00AM-12:00PM  Type of Therapy and Topic:  Group Therapy:  Music and Mood  Participation Level:  Did Not Attend   Description of Group: In this process group, members listened to a variety of genres of music and identified that different types of music evoke different responses.  Patients were encouraged to identify music that was soothing for them and music that was energizing for them.  Patients discussed how this knowledge can help with wellness and recovery in various ways including managing depression and anxiety as well as encouraging healthy sleep habits.    Therapeutic Goals: Patients will explore the impact of different varieties of music on mood Patients will verbalize the thoughts they have when listening to different types of music Patients will identify music that is soothing to them as well as music that is energizing to them Patients will discuss how to use this knowledge to assist in maintaining wellness and recovery Patients will explore the use of music as a coping skill  Summary of Patient Progress:   The patient did not attend group.  Therapeutic Modalities: Solution Focused Brief Therapy Activity   Ambrose Mantle, LCSW

## 2020-06-15 NOTE — Progress Notes (Addendum)
D:  Patient's self inventory sheet, patient sleeps good, no sleep medication.  Good appetite, low energy level, poor concentration.  Rated depression 8, hopeless 6, anxiety 3.  Denied withdrawals.  Denied SI.  Physical problems, lightheaded, dizziness, off balance.  Denied physical pain.  Goal is rest.  "I've been through hell."  Anything can happen after discharge. A:  Medications administered per MD orders.   Emotional support and encouragement given patient. R:  Dend SI and HI, contracts for safety.  Denied A/V hallucinations.  Safety maintained with 15 minute checks.      Marland Kitchen

## 2020-06-15 NOTE — Progress Notes (Signed)
   06/15/20 2050  COVID-19 Daily Checkoff  Have you had a fever (temp > 37.80C/100F)  in the past 24 hours?  No  If you have had runny nose, nasal congestion, sneezing in the past 24 hours, has it worsened? No  COVID-19 EXPOSURE  Have you traveled outside the state in the past 14 days? No  Have you been in contact with someone with a confirmed diagnosis of COVID-19 or PUI in the past 14 days without wearing appropriate PPE? No  Have you been living in the same home as a person with confirmed diagnosis of COVID-19 or a PUI (household contact)? No  Have you been diagnosed with COVID-19? No

## 2020-06-15 NOTE — Progress Notes (Signed)
[facility] MD Progress Note  06/15/2020 11:40 AM Carol Miles  MRN:  [id number] Subjective:  Patient is a 29 year old female with a past psychiatric history significant for bipolar disorder, polysubstance use disorders as well as posttraumatic stress disorder who was brought to the [facility] emergency department on 06/08/2020 by police. On the night of admission the patient had apparently had an argument with someone, and was found down in the middle of the street. She was noted to be angry, poor historian, angry.  Objective: Patient is seen and examined.  Patient is a 29 year old female with the above-stated past psychiatric history who is seen in follow-up.  She was significantly angry and hostile yesterday.  Review of the electronic medical record revealed that she had previously done fairly well on low-dose olanzapine.  It did not appear to be working at this point.  She had been started on Tegretol, and her Tegretol dosage was increased to 300 mg p.o. twice daily yesterday.  Review of the nursing notes reflect that even the increase in the olanzapine and that Tegretol did not control her.  She was significantly agitated throughout the evening.  There was concern because of her mania that included racial comments that there might be aggression and perhaps finding on the unit given the mixture of cultures and races.  She ended up receiving lorazepam and Geodon.  This morning I added Prolixin 5 mg p.o. twice daily, and currently the patient is sedated.  She refused to speak to me this morning.  Her vital signs are stable, she is afebrile.  She did sleep 6.25 hours last night.  No new laboratories.  Principal Problem: <principal problem not specified> Diagnosis: Active Problems:   Affective psychosis, bipolar (HCC)  Total Time spent with patient: 15 minutes  Past Psychiatric History: See admission H&P  Past Medical History:  Past Medical History:  Diagnosis Date  . Bipolar 1 disorder (HCC)   .  Depression   . HPV (human papilloma virus) anogenital infection   . PTSD (post-traumatic stress disorder)     Past Surgical History:  Procedure Laterality Date  . HERNIA REPAIR     Family History:  Family History  Problem Relation Age of Onset  . OCD Brother    Family Psychiatric  History: See admission H&P Social History:  Social History   Substance and Sexual Activity  Alcohol Use Yes   Comment: occ has a beer      Social History   Substance and Sexual Activity  Drug Use Yes  . Types: Methamphetamines, Marijuana   Comment: heroin    Social History   Socioeconomic History  . Marital status: Single    Spouse name: Not on file  . Number of children: Not on file  . Years of education: Not on file  . Highest education level: Not on file  Occupational History  . Not on file  Tobacco Use  . Smoking status: Current Every Day Smoker    Packs/day: 1.00    Years: 6.00    Pack years: 6.00    Types: Cigarettes  . Smokeless tobacco: Never Used  Vaping Use  . Vaping Use: Never used  Substance and Sexual Activity  . Alcohol use: Yes    Comment: occ has a beer   . Drug use: Yes    Types: Methamphetamines, Marijuana    Comment: heroin  . Sexual activity: Not on file  Other Topics Concern  . Not on file  Social History Narrative  .  Not on file   Social Determinants of Health   Financial Resource Strain:   . Difficulty of Paying Living Expenses: Not on file  Food Insecurity:   . Worried About Programme researcher, broadcasting/film/videounning Out of Food in the Last Year: Not on file  . Ran Out of Food in the Last Year: Not on file  Transportation Needs:   . Lack of Transportation (Medical): Not on file  . Lack of Transportation (Non-Medical): Not on file  Physical Activity:   . Days of Exercise per Week: Not on file  . Minutes of Exercise per Session: Not on file  Stress:   . Feeling of Stress : Not on file  Social Connections:   . Frequency of Communication with Friends and Family: Not on file  .  Frequency of Social Gatherings with Friends and Family: Not on file  . Attends Religious Services: Not on file  . Active Member of Clubs or Organizations: Not on file  . Attends BankerClub or Organization Meetings: Not on file  . Marital Status: Not on file   Additional Social History:    Pain Medications: See MAR Prescriptions: See MAR Over the Counter: See MAR History of alcohol / drug use?: Yes Negative Consequences of Use: Personal relationships, Financial, Work / School                    Sleep: Fair  Appetite:  Fair  Current Medications: Current Facility-Administered Medications  Medication Dose Route Frequency Provider Last Rate Last Admin  . acetaminophen (TYLENOL) tablet 650 mg  650 mg Oral Q6H PRN Aldean BakerSykes, Janet E, NP   650 mg at 06/14/20 2035  . alum & mag hydroxide-simeth (MAALOX/MYLANTA) 200-200-20 MG/5ML suspension 30 mL  30 mL Oral Q4H PRN Aldean BakerSykes, Janet E, NP      . carbamazepine (TEGRETOL) chewable tablet 300 mg  300 mg Oral BID Antonieta Pertlary, Jannah Guardiola Lawson, MD   300 mg at 06/15/20 0900  . fluPHENAZine (PROLIXIN) tablet 5 mg  5 mg Oral BID Antonieta Pertlary, Curry Seefeldt Lawson, MD   5 mg at 06/15/20 0900  . hydrOXYzine (ATARAX/VISTARIL) tablet 25 mg  25 mg Oral TID PRN Aldean BakerSykes, Janet E, NP   25 mg at 06/14/20 1223  . LORazepam (ATIVAN) tablet 1 mg  1 mg Oral Q6H PRN Antonieta Pertlary, Jabar Krysiak Lawson, MD      . magnesium hydroxide (MILK OF MAGNESIA) suspension 30 mL  30 mL Oral Daily PRN Aldean BakerSykes, Janet E, NP      . nicotine polacrilex (NICORETTE) gum 2 mg  2 mg Oral PRN Antonieta Pertlary, Ricke Kimoto Lawson, MD   2 mg at 06/14/20 1223  . OLANZapine zydis (ZYPREXA) disintegrating tablet 10 mg  10 mg Oral BID PRN Antonieta Pertlary, Dempsey Ahonen Lawson, MD   10 mg at 06/14/20 1225  . OLANZapine zydis (ZYPREXA) disintegrating tablet 15 mg  15 mg Oral QHS Antonieta Pertlary, Nyaire Denbleyker Lawson, MD      . traZODone (DESYREL) tablet 50 mg  50 mg Oral QHS PRN Aldean BakerSykes, Janet E, NP   50 mg at 06/14/20 2035  . ziprasidone (GEODON) injection 20 mg  20 mg Intramuscular Q6H PRN Antonieta Pertlary, Takeisha Cianci  Lawson, MD        Lab Results:  Results for orders placed or performed during the hospital encounter of 06/10/20 (from the past 48 hour(s))  TSH     Status: None   Collection Time: 06/13/20  5:53 PM  Result Value Ref Range   TSH 1.005 0.350 - 4.500 uIU/mL    Comment: Performed  by a 3rd Generation assay with a functional sensitivity of <=0.01 uIU/mL. Performed at [facility] : [facility], [address], [place], [place] [address]     Blood Alcohol level:  Lab Results  Component Value Date   ETH <10 06/08/2020   ETH <10 05/06/2020    Metabolic Disorder Labs: Lab Results  Component Value Date   HGBA1C 4.9 05/10/2020   MPG 93.93 05/10/2020   No results found for: PROLACTIN Lab Results  Component Value Date   CHOL 149 05/10/2020   TRIG 78 05/10/2020   HDL 47 05/10/2020   CHOLHDL 3.2 05/10/2020   VLDL 16 05/10/2020   LDLCALC 86 05/10/2020    Physical Findings: AIMS: Facial and Oral Movements Muscles of Facial Expression: None, normal Lips and Perioral Area: None, normal Jaw: None, normal Tongue: None, normal,Extremity Movements Upper (arms, wrists, hands, fingers): None, normal Lower (legs, knees, ankles, toes): None, normal, Trunk Movements Neck, shoulders, hips: None, normal, Overall Severity Severity of abnormal movements (highest score from questions above): None, normal Incapacitation due to abnormal movements: None, normal Patient's awareness of abnormal movements (rate only patient's report): No Awareness, Dental Status Current problems with teeth and/or dentures?: No Does patient usually wear dentures?: No  CIWA:  CIWA-Ar Total: 2 COWS:  COWS Total Score: 1  Musculoskeletal: Strength & Muscle Tone: within normal limits Gait & Station: normal Patient leans: N/A  Psychiatric Specialty Exam: Physical Exam Vitals and nursing note reviewed.  HENT:     Head: Normocephalic and atraumatic.  Pulmonary:     Effort: Pulmonary effort is normal.   Neurological:     General: No focal deficit present.     Mental Status: She is alert.     Review of Systems  Blood pressure 105/68, pulse 80, temperature 98.3 F (36.8 C), temperature source Oral, resp. rate 16, height 5\' 7"  (1.702 m), weight 59.9 kg, last menstrual period 05/29/2020, SpO2 99 %.Body mass index is 20.67 kg/m.  General Appearance: Disheveled  Eye Contact:  Minimal  Speech:  Normal Rate  Volume:  Decreased  Mood:  Dysphoric  Affect:  Flat  Thought Process:  Goal Directed and Descriptions of Associations: Circumstantial  Orientation:  Negative  Thought Content:  Delusions, Paranoid Ideation and Tangential  Suicidal Thoughts:  No  Homicidal Thoughts:  No  Memory:  Immediate;   Poor Recent;   Poor Remote;   Poor  Judgement:  Impaired  Insight:  Lacking  Psychomotor Activity:  Decreased  Concentration:  Concentration: Fair and Attention Span: Fair  Recall:  Poor  Fund of Knowledge:  Poor  Language:  Fair  Akathisia:  Negative  Handed:  Right  AIMS (if indicated):     Assets:  Desire for Improvement Resilience  ADL's:  Intact  Cognition:  WNL  Sleep:  Number of Hours: 6.25     Treatment Plan Summary: Daily contact with patient to assess and evaluate symptoms and progress in treatment, Medication management and Plan : Patient is seen and examined.  Patient is a 29 year old female with the above-stated past psychiatric history who is seen in follow-up.   Diagnosis: 1. Bipolar disorder, most recently manic, severe with psychotic features 2. History of polysubstance use disorders versus dependence. 3. Reported history of posttraumatic stress disorder.  Pertinent findings on examination today: 1.  Earlier this a.m. significantly irritable, angry and at times paranoid. 2.  After adjustments of medication she is currently asleep right now. 3.  Secondary to paranoia, agitation and threatening behavior Prolixin was added.  Plan: 1.  Add Prolixin 5 mg p.o.  twice daily for psychosis and agitation. 2.  Continue Tegretol 300 mg p.o. twice daily for mood stability. 3.  Continue lorazepam 1 mg p.o. every 6 hours as needed agitation. 4.  Decrease Zyprexa to 10 mg p.o. nightly for psychosis and mood stability. 5.  Continue trazodone 50 mg p.o. nightly as needed insomnia. 6.  Continue Geodon 20 mg IM every 6 hours as needed agitation. 7.  Check Tegretol level, liver function enzymes and CBC with differential in a.m. tomorrow. 8.  Disposition planning-in progress.  Antonieta Pert, MD 06/15/2020, 11:40 AM

## 2020-06-15 NOTE — Plan of Care (Signed)
Nurse discussed anxiety, depression and coping skills with patient.  

## 2020-06-16 MED ORDER — FLUPHENAZINE HCL 2.5 MG PO TABS
7.5000 mg | ORAL_TABLET | Freq: Every day | ORAL | Status: DC
  Filled 2020-06-16 (×2): qty 3

## 2020-06-16 MED ORDER — OLANZAPINE 5 MG PO TBDP
5.0000 mg | ORAL_TABLET | Freq: Every day | ORAL | Status: DC
  Administered 2020-06-16: 5 mg via ORAL
  Filled 2020-06-16 (×2): qty 1

## 2020-06-16 MED ORDER — FLUPHENAZINE HCL 5 MG PO TABS
5.0000 mg | ORAL_TABLET | Freq: Every day | ORAL | Status: DC
  Administered 2020-06-17 – 2020-06-18 (×2): 5 mg via ORAL
  Filled 2020-06-16 (×3): qty 1
  Filled 2020-06-16: qty 7

## 2020-06-16 MED ORDER — FLUPHENAZINE HCL 5 MG PO TABS
10.0000 mg | ORAL_TABLET | Freq: Every day | ORAL | Status: DC
  Administered 2020-06-16 – 2020-06-17 (×2): 10 mg via ORAL
  Filled 2020-06-16 (×2): qty 1
  Filled 2020-06-16: qty 2
  Filled 2020-06-16: qty 14
  Filled 2020-06-16: qty 1

## 2020-06-16 NOTE — Progress Notes (Signed)
Psychoeducational Group Note  Date:  06/16/2020 Time:  2034  Group Topic/Focus:  Wrap-Up Group:   The focus of this group is to help patients review their daily goal of treatment and discuss progress on daily workbooks.  Participation Level: Did Not Attend  Participation Quality:  Not Applicable  Affect:  Not Applicable  Cognitive:  Not Applicable  Insight:  Not Applicable  Engagement in Group: Not Applicable  Additional Comments:  The patient did not attend group.   Hazle Coca S 06/16/2020, 8:33 PM

## 2020-06-16 NOTE — Progress Notes (Signed)
Adult Psychoeducational Group Note  Date:  06/16/2020 Time:  12:12 AM  Group Topic/Focus:  Wrap-Up Group:   The focus of this group is to help patients review their daily goal of treatment and discuss progress on daily workbooks.  Participation Level:  Minimal  Participation Quality:  Intrusive, Inattentive, Monopolizing and Resistant  Affect:  Anxious, Depressed, Flat, Irritable, Labile, Lethargic, Not Congruent and Tearful  Cognitive:  Disorganized, Confused, Delusional and Lacking  Insight: Lacking and Limited  Engagement in Group:  Lacking and Limited  Modes of Intervention:  Discussion  Additional Comments:  Pt could not tell writer her goal for today.   Pt stated she talk with her doctor and social worker, regarding her care today. Pt stated been able to contact her mother  today, improved her overall day. Pt rated her overall day a 9 out of 10. Pt stated her appetite was poor today and she attended only lunch and dinner today. Pt stated her sleep last night was good. Pt stated the goal for tonight was to get some rest. Pt stated she was in no physical pain. Pt deny auditory or visual hallucinations. Pt denies thoughts of harming herself or others. Pt stated she would alert staff if anything changes. Felipa Furnace 06/16/2020, 12:12 AM

## 2020-06-16 NOTE — Progress Notes (Signed)
[facility] MD Progress Note  06/16/2020 10:48 AM Carol Miles  MRN:  [id number] Subjective:  Patient is a 29 year old female with a past psychiatric history significant for bipolar disorder, polysubstance use disorders as well as posttraumatic stress disorder who was brought to the [facility] emergency department on 06/08/2020 by police. On the night of admission the patient had apparently had an argument with someone, and was found down in the middle of the street. She was noted to be angry, poor historian, angry.  She was able to sleep 7 hours last night.  Her current medications include Tegretol, Prolixin, hydroxyzine, lorazepam, Zyprexa and trazodone.  Apparently she refused to have her labs drawn this morning, and we will have to order those for this afternoon and extrapolate the levels and significance.  Her vital signs are stable, she is afebrile.  Objective: Patient is seen and examined.  Patient is a 29 year old female with the above-stated past psychiatric history who is seen in follow-up.  She remains irritable but certainly not as angry as she was.  She really refuses discussion of anything.  She pulls the sheet back of her head and said "I cannot talk to anybody until I take my coffee".  As stated above, this is less irritable and angry than she has been.  Principal Problem: <principal problem not specified> Diagnosis: Active Problems:   Affective psychosis, bipolar (HCC)  Total Time spent with patient: 15 minutes  Past Psychiatric History: See admission H&P  Past Medical History:  Past Medical History:  Diagnosis Date  . Bipolar 1 disorder (HCC)   . Depression   . HPV (human papilloma virus) anogenital infection   . PTSD (post-traumatic stress disorder)     Past Surgical History:  Procedure Laterality Date  . HERNIA REPAIR     Family History:  Family History  Problem Relation Age of Onset  . OCD Brother    Family Psychiatric  History: See admission H&P Social History:   Social History   Substance and Sexual Activity  Alcohol Use Yes   Comment: occ has a beer      Social History   Substance and Sexual Activity  Drug Use Yes  . Types: Methamphetamines, Marijuana   Comment: heroin    Social History   Socioeconomic History  . Marital status: Single    Spouse name: Not on file  . Number of children: Not on file  . Years of education: Not on file  . Highest education level: Not on file  Occupational History  . Not on file  Tobacco Use  . Smoking status: Current Every Day Smoker    Packs/day: 1.00    Years: 6.00    Pack years: 6.00    Types: Cigarettes  . Smokeless tobacco: Never Used  Vaping Use  . Vaping Use: Never used  Substance and Sexual Activity  . Alcohol use: Yes    Comment: occ has a beer   . Drug use: Yes    Types: Methamphetamines, Marijuana    Comment: heroin  . Sexual activity: Not on file  Other Topics Concern  . Not on file  Social History Narrative  . Not on file   Social Determinants of Health   Financial Resource Strain:   . Difficulty of Paying Living Expenses: Not on file  Food Insecurity:   . Worried About Programme researcher, broadcasting/film/video in the Last Year: Not on file  . Ran Out of Food in the Last Year: Not on file  Transportation  Needs:   . Lack of Transportation (Medical): Not on file  . Lack of Transportation (Non-Medical): Not on file  Physical Activity:   . Days of Exercise per Week: Not on file  . Minutes of Exercise per Session: Not on file  Stress:   . Feeling of Stress : Not on file  Social Connections:   . Frequency of Communication with Friends and Family: Not on file  . Frequency of Social Gatherings with Friends and Family: Not on file  . Attends Religious Services: Not on file  . Active Member of Clubs or Organizations: Not on file  . Attends Banker Meetings: Not on file  . Marital Status: Not on file   Additional Social History:    Pain Medications: See MAR Prescriptions: See  MAR Over the Counter: See MAR History of alcohol / drug use?: Yes Negative Consequences of Use: Personal relationships, Financial, Work / School                    Sleep: Good  Appetite:  Fair  Current Medications: Current Facility-Administered Medications  Medication Dose Route Frequency Provider Last Rate Last Admin  . acetaminophen (TYLENOL) tablet 650 mg  650 mg Oral Q6H PRN Aldean Baker, NP   650 mg at 06/14/20 2035  . alum & mag hydroxide-simeth (MAALOX/MYLANTA) 200-200-20 MG/5ML suspension 30 mL  30 mL Oral Q4H PRN Aldean Baker, NP      . carbamazepine (TEGRETOL) chewable tablet 300 mg  300 mg Oral BID Antonieta Pert, MD   300 mg at 06/15/20 1717  . fluPHENAZine (PROLIXIN) tablet 5 mg  5 mg Oral BID Antonieta Pert, MD   5 mg at 06/15/20 2046  . hydrOXYzine (ATARAX/VISTARIL) tablet 25 mg  25 mg Oral TID PRN Aldean Baker, NP   25 mg at 06/15/20 2046  . LORazepam (ATIVAN) tablet 1 mg  1 mg Oral Q6H PRN Antonieta Pert, MD      . magnesium hydroxide (MILK OF MAGNESIA) suspension 30 mL  30 mL Oral Daily PRN Aldean Baker, NP      . nicotine polacrilex (NICORETTE) gum 2 mg  2 mg Oral PRN Antonieta Pert, MD   2 mg at 06/14/20 1223  . OLANZapine zydis (ZYPREXA) disintegrating tablet 10 mg  10 mg Oral BID PRN Antonieta Pert, MD   10 mg at 06/14/20 1225  . OLANZapine zydis (ZYPREXA) disintegrating tablet 10 mg  10 mg Oral QHS Antonieta Pert, MD   10 mg at 06/15/20 2046  . traZODone (DESYREL) tablet 50 mg  50 mg Oral QHS PRN Aldean Baker, NP   50 mg at 06/15/20 2046  . ziprasidone (GEODON) injection 20 mg  20 mg Intramuscular Q6H PRN Antonieta Pert, MD        Lab Results: No results found for this or any previous visit (from the past 48 hour(s)).  Blood Alcohol level:  Lab Results  Component Value Date   ETH <10 06/08/2020   ETH <10 05/06/2020    Metabolic Disorder Labs: Lab Results  Component Value Date   HGBA1C 4.9 05/10/2020   MPG  93.93 05/10/2020   No results found for: PROLACTIN Lab Results  Component Value Date   CHOL 149 05/10/2020   TRIG 78 05/10/2020   HDL 47 05/10/2020   CHOLHDL 3.2 05/10/2020   VLDL 16 05/10/2020   LDLCALC 86 05/10/2020    Physical Findings: AIMS: Facial and  Oral Movements Muscles of Facial Expression: None, normal Lips and Perioral Area: None, normal Jaw: None, normal Tongue: None, normal,Extremity Movements Upper (arms, wrists, hands, fingers): None, normal Lower (legs, knees, ankles, toes): None, normal, Trunk Movements Neck, shoulders, hips: None, normal, Overall Severity Severity of abnormal movements (highest score from questions above): None, normal Incapacitation due to abnormal movements: None, normal Patient's awareness of abnormal movements (rate only patient's report): No Awareness, Dental Status Current problems with teeth and/or dentures?: No Does patient usually wear dentures?: No  CIWA:  CIWA-Ar Total: 2 COWS:  COWS Total Score: 1  Musculoskeletal: Strength & Muscle Tone: within normal limits Gait & Station: normal Patient leans: N/A  Psychiatric Specialty Exam: Physical Exam Vitals and nursing note reviewed.  HENT:     Head: Normocephalic and atraumatic.  Pulmonary:     Effort: Pulmonary effort is normal.  Neurological:     General: No focal deficit present.     Mental Status: She is alert.     Review of Systems  Blood pressure 105/68, pulse 80, temperature 98.3 F (36.8 C), temperature source Oral, resp. rate 16, height 5\' 7"  (1.702 m), weight 59.9 kg, last menstrual period 05/29/2020, SpO2 99 %.Body mass index is 20.67 kg/m.  General Appearance: Disheveled  Eye Contact:  Minimal  Speech:  Normal Rate  Volume:  Normal  Mood:  Irritable  Affect:  Constricted  Thought Process:  Goal Directed and Descriptions of Associations: Circumstantial  Orientation:  Negative  Thought Content:  Paranoid Ideation  Suicidal Thoughts:  No  Homicidal  Thoughts:  No  Memory:  Immediate;   Poor Recent;   Poor Remote;   Poor  Judgement:  Impaired  Insight:  Lacking  Psychomotor Activity:  Normal  Concentration:  Concentration: Fair and Attention Span: Fair  Recall:  07/29/2020 of Knowledge:  Poor  Language:  Good  Akathisia:  Negative  Handed:  Right  AIMS (if indicated):     Assets:  Desire for Improvement Resilience  ADL's:  Impaired  Cognition:  WNL  Sleep:  Number of Hours: 7     Treatment Plan Summary: Daily contact with patient to assess and evaluate symptoms and progress in treatment, Medication management and Plan : Patient is seen and examined.  Patient is a 29 year old female with the above-stated past psychiatric history who is seen in follow-up.   Diagnosis: 1. Bipolar disorder, most recently manic, severe with psychotic features 2. History of polysubstance use disorders versus dependence. 3. Reported history of posttraumatic stress disorder.  Pertinent findings on examination today: 1.  Some improvement in irritability. 2.  Sleep is improved. 3.  Still some degree of paranoia. 4.  Patient refused Tegretol level, liver function enzymes and CBC.  Plan: 1.  Change labs to this p.m. to look at CBC with differential, Tegretol level and CBC. 2.  Increase Prolixin to 2.5 mg p.o. daily and 7.5 mg p.o. nightly for paranoia, lability and irritability. 3.  Continue lorazepam 1 mg p.o. every 6 hours as needed agitation or anxiety. 4.  Continue Zyprexa Zydis 10 mg p.o. twice daily as needed agitation. 5.  Decrease Zyprexa Zydis to 5 mg p.o. nightly for mood stability and sleep. 6.  Continue trazodone 50 mg p.o. nightly as needed insomnia. 7.  Disposition planning-in progress.  25, MD 06/16/2020, 10:48 AM

## 2020-06-16 NOTE — Progress Notes (Signed)
   06/16/20 2000  Psych Admission Type (Psych Patients Only)  Admission Status Involuntary  Psychosocial Assessment  Patient Complaints Anxiety  Eye Contact Fair  Facial Expression Anxious;Pensive  Affect Anxious  Speech Aggressive;Argumentative;Logical/coherent  Interaction Arrogant;Attention-seeking;Forwards little  Motor Activity Fidgety;Restless  Appearance/Hygiene Unremarkable  Behavior Characteristics Cooperative  Mood Anxious  Thought Process  Coherency Concrete thinking  Content Blaming others  Delusions Controlled;Persecutory  Perception WDL  Hallucination None reported or observed  Judgment Limited  Confusion None  Danger to Self  Current suicidal ideation? Denies  Danger to Others  Danger to Others None reported or observed

## 2020-06-16 NOTE — Progress Notes (Signed)
  Problem: Activity: Goal: Imbalance in normal sleep/wake cycle will improve Outcome: Progressing   

## 2020-06-16 NOTE — Plan of Care (Signed)
Progress note  D: pt found in bed; compliant with medication administration. Pt is guarded and minimal with assessment. Pt continues to be labile, telling staff to "go fu** themselves" multiple times. Pt has been more reclusive to their room today. Pt denies any physical symptoms or pain. Pt is agitated and anxious in affect. Pt denies si/hi/ah/vh and verbally agrees to approach staff if these become apparent or before harming themself/others while at [facility].  A: Pt provided support and encouragement. Pt given medication per protocol and standing orders. Q19m safety checks implemented and continued.  R: Pt safe on the unit. Will continue to monitor.  Pt progressing in the following metrics  Problem: Activity: Goal: Imbalance in normal sleep/wake cycle will improve Outcome: Progressing   Problem: Education: Goal: Verbalization of understanding the information provided will improve Outcome: Progressing   Problem: Activity: Goal: Sleeping patterns will improve Outcome: Progressing   Problem: Health Behavior/Discharge Planning: Goal: Compliance with treatment plan for underlying cause of condition will improve Outcome: Progressing

## 2020-06-16 NOTE — Progress Notes (Signed)
Recreation Therapy Notes  Date: 8.30.21 Time: 1000 Location: 500 Hall Dayroom  Group Topic: Coping Skills  Goal Area(s) Addresses:  Patient will identify positive coping strategies. Patient will identify benefit of using positive coping strategies.  Intervention: Worksheet, Programmer, multimedia  Activity: Mind Map.  LRT and patients filled out the first 8 boxes (anger, grief/guilt, sorrow, laziness, loneliness, depression, anxiety and pain).  Patients were then given the time to come up with at three coping skills for each situation.  LRT would then write the coping skills on the board with the corresponding situation.  Education: Pharmacologist, Building control surveyor.   Education Outcome: Acknowledges understanding/In group clarification offered/Needs additional education.   Clinical Observations/Feedback: Pt did not attend group session.    Caroll Rancher, LRT/CTRS         Caroll Rancher A 06/16/2020 11:58 AM

## 2020-06-17 MED ORDER — CARBAMAZEPINE 100 MG PO CHEW
200.0000 mg | CHEWABLE_TABLET | Freq: Two times a day (BID) | ORAL | Status: DC
  Administered 2020-06-17 – 2020-06-18 (×2): 200 mg via ORAL
  Filled 2020-06-17: qty 28
  Filled 2020-06-17 (×3): qty 2
  Filled 2020-06-17: qty 28
  Filled 2020-06-17: qty 2

## 2020-06-17 NOTE — Progress Notes (Signed)
When writer was getting report at the nursing station, pt came up and started saying information to Retail banker. Writer explained to pt that we were getting report and was not talking to pt. Pt began Field seismologist names and continued to curse at Clinical research associate. Pt continued to talk to writer in a disrespectful way. Writer explained that if Clinical research associate was going to be disrespectful that pt needed to remove herself from the nursing station. Pt continued to talk down to writer in a very disrespectful tone.

## 2020-06-17 NOTE — Progress Notes (Signed)
Dar Note: Patient continues with irritable affect and mood. Denies suicidal ideation, auditory and visual hallucinations.   Medication given as prescribed.  Stayed in her room for majority of this shift.  Patient had a verbal outburst while on the phone with her mother.  Routine safety checks maintained every 15 minutes.  Patient is safe on and off the unit.

## 2020-06-17 NOTE — Progress Notes (Signed)
Pt has been very disrespectful towards writer since night shift started.     06/17/20 2000  Psych Admission Type (Psych Patients Only)  Admission Status Involuntary  Psychosocial Assessment  Patient Complaints Irritability  Eye Contact Fair  Facial Expression Anxious;Pensive  Affect Anxious  Speech Aggressive;Argumentative;Logical/coherent  Interaction Arrogant;Attention-seeking;Forwards little  Motor Activity Fidgety;Restless  Appearance/Hygiene Unremarkable  Behavior Characteristics Cooperative  Mood Labile  Thought Process  Coherency Concrete thinking  Content Blaming others  Delusions Controlled;Persecutory  Perception WDL  Hallucination None reported or observed  Judgment Limited  Confusion None  Danger to Self  Current suicidal ideation? Denies  Danger to Others  Danger to Others None reported or observed

## 2020-06-17 NOTE — Progress Notes (Signed)
[facility]  06/17/2020 10:29 AM Carol Miles  MRN:  [id number] Subjective:  Patient is a 29 year old female with a past psychiatric history significant for bipolar disorder, polysubstance use disorders as well as posttraumatic stress disorder who was brought to the [facility] emergency department on 06/08/2020 by police. On the night of admission the patient had apparently had an argument with someone, and was found down in the middle of the street. She was noted to be angry, poor historian, angry.    Objective: Patient is seen and examined.  Patient is a 29 year old female with the above-stated past psychiatric history who is seen in follow-up.  Her irritability seems to be slowing down.  Review of the notes from last night from staff revealed that the patient was guarded with minimal contact.  She still was labile and telling staff to go FU themselves.  She appeared to be more reclusive to her room yesterday.  She is not interested in conversing very much today.  Her vital signs were not recorded.  Her sleep did improve.  She got 6.25 hours of sleep last night.  Her medications include Tegretol 300 mg p.o. twice daily, Prolixin 5 mg p.o. daily 10 mg p.o. nightly, Ativan as needed, hydroxyzine as needed, Zyprexa 5 mg p.o. nightly as well as as needed.  She again refused laboratories last p.m.  Principal Problem: <principal problem not specified> Diagnosis: Active Problems:   Affective psychosis, bipolar (HCC)  Total Time spent with patient: 15 minutes  Past Psychiatric History: See admission H&P  Past Medical History:  Past Medical History:  Diagnosis Date  . Bipolar 1 disorder (HCC)   . Depression   . HPV (human papilloma virus) anogenital infection   . PTSD (post-traumatic stress disorder)     Past Surgical History:  Procedure Laterality Date  . HERNIA REPAIR     Family History:  Family History  Problem Relation Age of Onset  . OCD Brother    Family Psychiatric   History: See admission H&P Social History:  Social History   Substance and Sexual Activity  Alcohol Use Yes   Comment: occ has a beer      Social History   Substance and Sexual Activity  Drug Use Yes  . Types: Methamphetamines, Marijuana   Comment: heroin    Social History   Socioeconomic History  . Marital status: Single    Spouse name: Not on file  . Number of children: Not on file  . Years of education: Not on file  . Highest education level: Not on file  Occupational History  . Not on file  Tobacco Use  . Smoking status: Current Every Day Smoker    Packs/day: 1.00    Years: 6.00    Pack years: 6.00    Types: Cigarettes  . Smokeless tobacco: Never Used  Vaping Use  . Vaping Use: Never used  Substance and Sexual Activity  . Alcohol use: Yes    Comment: occ has a beer   . Drug use: Yes    Types: Methamphetamines, Marijuana    Comment: heroin  . Sexual activity: Not on file  Other Topics Concern  . Not on file  Social History Narrative  . Not on file   Social Determinants of Health   Financial Resource Strain:   . Difficulty of Paying Living Expenses: Not on file  Food Insecurity:   . Worried About Programme researcher, broadcasting/film/videounning Out of Food in the Last Year: Not on file  .  Ran Out of Food in the Last Year: Not on file  Transportation Needs:   . Lack of Transportation (Medical): Not on file  . Lack of Transportation (Non-Medical): Not on file  Physical Activity:   . Days of Exercise per Week: Not on file  . Minutes of Exercise per Session: Not on file  Stress:   . Feeling of Stress : Not on file  Social Connections:   . Frequency of Communication with Friends and Family: Not on file  . Frequency of Social Gatherings with Friends and Family: Not on file  . Attends Religious Services: Not on file  . Active Member of Clubs or Organizations: Not on file  . Attends Banker Meetings: Not on file  . Marital Status: Not on file   Additional Social History:    Pain  Medications: See MAR Prescriptions: See MAR Over the Counter: See MAR History of alcohol / drug use?: Yes Negative Consequences of Use: Personal relationships, Financial, Work / School                    Sleep: Good  Appetite:  Fair  Current Medications: Current Facility-Administered Medications  Medication Dose Route Frequency Provider Last Rate Last Admin  . acetaminophen (TYLENOL) tablet 650 mg  650 mg Oral Q6H PRN Aldean Baker, NP   650 mg at 06/14/20 2035  . alum & mag hydroxide-simeth (MAALOX/MYLANTA) 200-200-20 MG/5ML suspension 30 mL  30 mL Oral Q4H PRN Aldean Baker, NP      . carbamazepine (TEGRETOL) chewable tablet 300 mg  300 mg Oral BID Antonieta Pert, MD   300 mg at 06/17/20 0809  . fluPHENAZine (PROLIXIN) tablet 10 mg  10 mg Oral QHS Antonieta Pert, MD   10 mg at 06/16/20 2103  . fluPHENAZine (PROLIXIN) tablet 5 mg  5 mg Oral Daily Antonieta Pert, MD   5 mg at 06/17/20 6270  . hydrOXYzine (ATARAX/VISTARIL) tablet 25 mg  25 mg Oral TID PRN Aldean Baker, NP   25 mg at 06/16/20 1503  . LORazepam (ATIVAN) tablet 1 mg  1 mg Oral Q6H PRN Antonieta Pert, MD   1 mg at 06/16/20 1503  . magnesium hydroxide (MILK OF MAGNESIA) suspension 30 mL  30 mL Oral Daily PRN Aldean Baker, NP      . nicotine polacrilex (NICORETTE) gum 2 mg  2 mg Oral PRN Antonieta Pert, MD   2 mg at 06/16/20 1503  . OLANZapine zydis (ZYPREXA) disintegrating tablet 10 mg  10 mg Oral BID PRN Antonieta Pert, MD   10 mg at 06/16/20 1503  . OLANZapine zydis (ZYPREXA) disintegrating tablet 5 mg  5 mg Oral QHS Antonieta Pert, MD   5 mg at 06/16/20 2103  . traZODone (DESYREL) tablet 50 mg  50 mg Oral QHS PRN Aldean Baker, NP   50 mg at 06/15/20 2046  . ziprasidone (GEODON) injection 20 mg  20 mg Intramuscular Q6H PRN Antonieta Pert, MD        Lab Results: No results found for this or any previous visit (from the past 48 hour(s)).  Blood Alcohol level:  Lab Results   Component Value Date   [facility] <10 06/08/2020   ETH <10 05/06/2020    Metabolic Disorder Labs: Lab Results  Component Value Date   HGBA1C 4.9 05/10/2020   MPG 93.93 05/10/2020   No results found for: PROLACTIN Lab Results  Component Value  Date   CHOL 149 05/10/2020   TRIG 78 05/10/2020   HDL 47 05/10/2020   CHOLHDL 3.2 05/10/2020   VLDL 16 05/10/2020   LDLCALC 86 05/10/2020    Physical Findings: AIMS: Facial and Oral Movements Muscles of Facial Expression: None, normal Lips and Perioral Area: None, normal Jaw: None, normal Tongue: None, normal,Extremity Movements Upper (arms, wrists, hands, fingers): None, normal Lower (legs, knees, ankles, toes): None, normal, Trunk Movements Neck, shoulders, hips: None, normal, Overall Severity Severity of abnormal movements (highest score from questions above): None, normal Incapacitation due to abnormal movements: None, normal Patient's awareness of abnormal movements (rate only patient's report): No Awareness, Dental Status Current problems with teeth and/or dentures?: No Does patient usually wear dentures?: No  CIWA:  CIWA-Ar Total: 2 COWS:  COWS Total Score: 1  Musculoskeletal: Strength & Muscle Tone: within normal limits Gait & Station: normal Patient leans: N/A  Psychiatric Specialty Exam: Physical Exam Vitals and nursing note reviewed.  HENT:     Head: Normocephalic and atraumatic.  Pulmonary:     Effort: Pulmonary effort is normal.  Neurological:     General: No focal deficit present.     Mental Status: She is alert.     Review of Systems  Blood pressure 105/68, pulse 80, temperature 98.3 F (36.8 C), temperature source Oral, resp. rate 16, height 5\' 7"  (1.702 m), weight 59.9 kg, last menstrual period 05/29/2020, SpO2 99 %.Body mass index is 20.67 kg/m.  General Appearance: Disheveled  Eye Contact:  Fair  Speech:  Normal Rate  Volume:  Normal  Mood:  Dysphoric and Irritable  Affect:  Congruent  Thought  Process:  Goal Directed and Descriptions of Associations: Circumstantial  Orientation:  Full (Time, Place, and Person)  Thought Content:  Paranoid Ideation  Suicidal Thoughts:  No  Homicidal Thoughts:  No  Memory:  Immediate;   Fair Recent;   Fair Remote;   Fair  Judgement:  Impaired  Insight:  Lacking  Psychomotor Activity:  Normal  Concentration:  Concentration: Fair and Attention Span: Fair  Recall:  Poor  Fund of Knowledge:  Poor  Language:  Fair  Akathisia:  Negative  Handed:  Right  AIMS (if indicated):     Assets:  Desire for Improvement Resilience  ADL's:  Intact  Cognition:  WNL  Sleep:  Number of Hours: 6.25     Treatment Plan Summary: Daily contact with patient to assess and evaluate symptoms and progress in treatment, Medication management and Plan : Patient is seen and examined.  Patient is a 29 year old female with the above-stated past psychiatric history who is seen in follow-up.  Diagnosis: 1. Bipolar disorder, most recently manic, severe with psychotic features 2. History of polysubstance use disorders versus dependence. 3. Reported history of posttraumatic stress disorder.  Pertinent findings on examination today: 1.  Irritability continues to slowly improve, but still can be very labile at times. 2.  Sleep is improved. 3.  Patient once again has refused laboratories.  Plan: 1.  Reorder lab work and explained to patient necessity of this. 2.  Continue Prolixin 2.5 mg p.o. daily and 10 mg p.o. nightly for paranoia, lability as well as irritability. 3.  Continue lorazepam 1 mg p.o. every 6 hours as needed agitation or anxiety. 4.  Continue Zyprexa Zydis 10 mg p.o. twice daily as needed agitation. 5.  Stop Zyprexa Zydis to 5 mg p.o. nightly for mood stability and sleep. 6.  Continue trazodone 50 mg p.o. nightly as needed insomnia. 7.  Continue Tegretol 300 mg p.o. twice daily for mood stability.  8. Disposition planning-in progress. Antonieta Pert,  MD 06/17/2020, 10:29 AM

## 2020-06-17 NOTE — Progress Notes (Signed)
The patient's positive event for the day is that she made a new friend. She brought up in group that she tried to stick up for someone earlier in the day, but was redirected or yelled at. She did not go into further detail. She became upset with this author when he offered her help with making healthier decisions at snack time. Pt. Was concerned about gaining weight.

## 2020-06-18 DIAGNOSIS — F3160 Bipolar disorder, current episode mixed, unspecified: Principal | ICD-10-CM

## 2020-06-18 MED ORDER — FLUPHENAZINE HCL 5 MG PO TABS: ORAL_TABLET | ORAL | 0 refills | Status: DC

## 2020-06-18 MED ORDER — TRAZODONE HCL 50 MG PO TABS
50.0000 mg | ORAL_TABLET | Freq: Every evening | ORAL | 0 refills | Status: DC | PRN
Start: 2020-06-18 — End: 2020-07-30

## 2020-06-18 MED ORDER — HYDROXYZINE HCL 25 MG PO TABS
25.0000 mg | ORAL_TABLET | Freq: Three times a day (TID) | ORAL | 0 refills | Status: DC | PRN
Start: 2020-06-18 — End: 2020-07-29

## 2020-06-18 MED ORDER — CARBAMAZEPINE 100 MG PO CHEW: 200.0000 mg | CHEWABLE_TABLET | Freq: Two times a day (BID) | ORAL | 0 refills | Status: DC

## 2020-06-18 MED ORDER — NICOTINE POLACRILEX 2 MG MT GUM: 2.0000 mg | CHEWING_GUM | OROMUCOSAL | 0 refills | Status: DC | PRN

## 2020-06-18 NOTE — Progress Notes (Signed)
  [facility] Adult Case Management Discharge Plan :  Will you be returning to the same living situation after discharge:  No. Plans to go to mothers house at discharge. At discharge, do you have transportation home?: No. Declining all transportation. Do you have the ability to pay for your medications: No. Samples to be provided.   Release of information consent forms completed and in the chart;  Patient's signature needed at discharge.  Patient to Follow up at:  Follow-up Information    [facility] Follow up on 06/24/2020.   Why: Your appointment is scheduled for Tuesday, 06/24/20 at 240pm. This will be a Virtual appointment, please be available between 2:00 pm to 4:00 pm for the call.   Contact information: [address],  [place], [place] [address] Ph: [phone] Fax:[phone]              Next level of care provider has access to [facility] Link:no  Safety Planning and Suicide Prevention discussed: Yes,  with patient.  Have you used any form of tobacco in the last 30 days? (Cigarettes, Smokeless Tobacco, Cigars, and/or Pipes): Yes  Has patient been referred to the Quitline?: Patient refused referral  Patient has been referred for addiction treatment: Pt. refused referral  Otelia Santee, LCSW 06/18/2020, 9:48 AM

## 2020-06-18 NOTE — Discharge Summary (Signed)
Physician Discharge Summary Note  Patient:  Carol Miles is an 29 y.o., female MRN:  [id number] DOB:  [birth year] Patient phone:  [phone] (home)  Patient address:   [address] [place] [address],   Total Time spent with patient: Greater than 30 minutes  Date of Admission:  06/10/2020 Date of Discharge: 06-18-20  Reason for Admission: Mood instability.  Principal Problem: Bipolar I disorder, most recent episode mixed [facility])  Discharge Diagnoses: Principal Problem:   Bipolar I disorder, most recent episode mixed (HCC) Active Problems:   Affective psychosis, bipolar (HCC)  Past Psychiatric History: PTSD, Bipolar Disorder,                                              Polysubstance use disorder Past Medical History:  Past Medical History:  Diagnosis Date  . Bipolar 1 disorder (HCC)   . Depression   . HPV (human papilloma virus) anogenital infection   . PTSD (post-traumatic stress disorder)     Past Surgical History:  Procedure Laterality Date  . HERNIA REPAIR     Family History:  Family History  Problem Relation Age of Onset  . OCD Brother    Family Psychiatric  History: Schizophrenia: Maternal granddmother.  Social History:  Social History   Substance and Sexual Activity  Alcohol Use Yes   Comment: occ has a beer      Social History   Substance and Sexual Activity  Drug Use Yes  . Types: Methamphetamines, Marijuana   Comment: heroin    Social History   Socioeconomic History  . Marital status: Single    Spouse name: Not on file  . Number of children: Not on file  . Years of education: Not on file  . Highest education level: Not on file  Occupational History  . Not on file  Tobacco Use  . Smoking status: Current Every Day Smoker    Packs/day: 1.00    Years: 6.00    Pack years: 6.00    Types: Cigarettes  . Smokeless tobacco: Never Used  Vaping Use  . Vaping Use: Never used  Substance and Sexual Activity  . Alcohol use: Yes     Comment: occ has a beer   . Drug use: Yes    Types: Methamphetamines, Marijuana    Comment: heroin  . Sexual activity: Not on file  Other Topics Concern  . Not on file  Social History Narrative  . Not on file   Social Determinants of Health   Financial Resource Strain:   . Difficulty of Paying Living Expenses: Not on file  Food Insecurity:   . Worried About Programme researcher, broadcasting/film/video in the Last Year: Not on file  . Ran Out of Food in the Last Year: Not on file  Transportation Needs:   . Lack of Transportation (Medical): Not on file  . Lack of Transportation (Non-Medical): Not on file  Physical Activity:   . Days of Exercise per Week: Not on file  . Minutes of Exercise per Session: Not on file  Stress:   . Feeling of Stress : Not on file  Social Connections:   . Frequency of Communication with Friends and Family: Not on file  . Frequency of Social Gatherings with Friends and Family: Not on file  . Attends Religious Services: Not on file  . Active Member of Clubs  or Organizations: Not on file  . Attends Banker Meetings: Not on file  . Marital Status: Not on file   Hospital Course: (Per Md's admission evaluation notes):  Patient is seen and examined. Patient is a 29 year old female with a past psychiatric history significant for bipolar disorder, polysubstance use disorders as well as posttraumatic stress disorder who was brought to the [facility] emergency department on 06/08/2020 by police. There apparently is a Conservator, museum/gallery who works in the area who sees the patient on a fairly regular basis. The progress notes from [facility] stated that the deputy had last seen the patient on 06/02/2020 and the patient was fine at that time. Reportedly she had had an argument with someone on the night of admission, and she was found out in the middle of the street. She was noted to be angry and would not provide a great deal history. She denied suicidal ideation, and was not noted to be  psychotic. She was transferred to our facility for evaluation and stabilization. This morning the patient stated that she had stopped taking her medications directly after discharge. She stated she had been seen at [facility], and was told to follow-up there. She stated that she had gotten her prescriptions at a pharmacy, and they had "warned me about the side effects". She is not a good historian at this point. Her drug screen on admission at [facility] was negative for any substances. Her last admission to our facility was on 05/10/2020. She was admitted at that time for suicidal ideation. She had had a previous psychiatric admission at [facility] on 02/26/2019. She was treated with lithium and Zyprexa at that time. Her drug screen on admission at that time had marijuana as well as amphetamines. Patient stated she had not used amphetamines in approximately 2 weeks. She was admitted to the hospital for evaluation and stabilization.  This istheseconddischarge summary for this49year old Caucasian female from this [facility] in less than 2 months. She was a patient in this hospital this past July, 23rd 2021 thru July, 27th, 2021 with similar presentation.Carol Miles hasprevious hx of mental illness &polysubstancesubstance use disorders.She was brought to the hospital this time for mood instability. She was in need of mood stabilization treatments.  After evaluation of herpresenting symptoms this time around, it was jointly agreed by the treatment team torecommend Elsbethfor mood stabilization treatments. Themedication regimentargeting those presenting symptomswere discussed &initiatedwith herconsent. Shewas treated & stabilized on themedicationsas listed below on her discharge medication lists. She was also enrolled & participated in the group counseling sessions being offered & held on this unit.He learned coping skills.She presented no other significant medical issues that required  treatment  & or monitoring.She tolerated hertreatment regimen without any adverse effects or reactions reported.   Carol Miles's symptoms responded well to hertreatment regimen. This is evidenced by herreports of improved mood, resolution of symptoms &presentation of good affect/eye contact.Sheiscurrently mentally & medically stable for dischargeto continue mental health careon an outpatient basis as recommended below.   Today upon her discharge evaluationwith the attending psychiatrist today,Elsbethshares, "I'm doing good. I feel much better".She denies any specific concerns.She is sleeping well. Herappetite is good. She denies other physical complaints.She denies SI/HI/AH/VH, delusional thoughts or paranoia.She is tolerating hermedications well &in agreement to continue hercurrent regimen as recommended.She will have follow up for routine psychiatric care&medication management as noted below. She is provided with all the necessary information needed to make these appointments without problems. She was able to engage in safety planning including  [facility] or contact emergency services if she feels unable to maintain herown safety or the safety of others. Pt had no further questions, comments or concerns. [facility] in no apparent distress.She declines transportation arrangements.  Physical Findings: AIMS: Facial and Oral Movements Muscles of Facial Expression: None, normal Lips and Perioral Area: None, normal Jaw: None, normal Tongue: None, normal,Extremity Movements Upper (arms, wrists, hands, fingers): None, normal Lower (legs, knees, ankles, toes): None, normal, Trunk Movements Neck, shoulders, hips: None, normal, Overall Severity Severity of abnormal movements (highest score from questions above): None, normal Incapacitation due to abnormal movements: None, normal Patient's awareness of abnormal movements (rate only patient's  report): No Awareness, Dental Status Current problems with teeth and/or dentures?: No Does patient usually wear dentures?: No  CIWA:  CIWA-Ar Total: 2 COWS:  COWS Total Score: 1  Musculoskeletal: Strength & Muscle Tone: within normal limits Gait & Station: normal Patient leans: N/A  Psychiatric Specialty Exam: Physical Exam Vitals and nursing note reviewed.  HENT:     Head: Normocephalic.     Nose: Nose normal.     Mouth/Throat:     Pharynx: Oropharynx is clear.  Eyes:     Pupils: Pupils are equal, round, and reactive to light.  Cardiovascular:     Rate and Rhythm: Normal rate.     Pulses: Normal pulses.  Pulmonary:     Effort: Pulmonary effort is normal.  Genitourinary:    Comments: Deferred Musculoskeletal:        General: Normal range of motion.     Cervical back: Normal range of motion.  Skin:    General: Skin is warm and dry.  Neurological:     Mental Status: She is alert and oriented to person, place, and time. Mental status is at baseline.     Review of Systems  Constitutional: Negative for chills, diaphoresis and fever.  HENT: Negative for congestion, rhinorrhea, sneezing and sore throat.   Eyes: Negative for discharge.  Respiratory: Negative for cough, chest tightness and wheezing.   Cardiovascular: Negative for chest pain and palpitations.  Gastrointestinal: Negative for diarrhea, nausea and vomiting.  Endocrine: Negative for cold intolerance.  Genitourinary: Negative for difficulty urinating.  Musculoskeletal: Negative for arthralgias and myalgias.  Skin: Negative.   Allergic/Immunologic: Positive for food allergies (Banana). Negative for environmental allergies.       Allergies: Amoxicillin, Sulfa drugs.  Allergies to food: Banana  Neurological: Negative for dizziness, tremors, seizures, syncope, facial asymmetry, speech difficulty, weakness, light-headedness, numbness and headaches.  Psychiatric/Behavioral: Positive for dysphoric mood (Stabilized with  medication prior to discharge. ), hallucinations (Hx. psychosis (Stabilized with medication prior to discharge). ) and sleep disturbance (Stabilized with medication prior to discharge). Negative for agitation, behavioral problems, confusion, decreased concentration, self-injury and suicidal ideas. The patient is not nervous/anxious (Stable upon discharge) and is not hyperactive.     Blood pressure (!) 103/48, pulse 80, temperature 98.3 F (36.8 C), temperature source Oral, resp. rate 16, height 5\' 7"  (1.702 m), weight 59.9 kg, last menstrual period 05/29/2020, SpO2 99 %.Body mass index is 20.67 kg/m.  See Md's admission SRA  Sleep:  Number of Hours: 6.5   Have you used any form of tobacco in the last 30 days? (Cigarettes, Smokeless Tobacco, Cigars, and/or Pipes): Yes  Has this patient used any form of tobacco in the last 30 days? (Cigarettes, Smokeless Tobacco, Cigars, and/or Pipes):    Blood Alcohol level:  Lab Results  Component Value  Date   ETH <10 06/08/2020   ETH <10 05/06/2020   Metabolic Disorder Labs:  Lab Results  Component Value Date   HGBA1C 4.9 05/10/2020   MPG 93.93 05/10/2020   No results found for: PROLACTIN Lab Results  Component Value Date   CHOL 149 05/10/2020   TRIG 78 05/10/2020   HDL 47 05/10/2020   CHOLHDL 3.2 05/10/2020   VLDL 16 05/10/2020   LDLCALC 86 05/10/2020    See Psychiatric Specialty Exam and Suicide Risk Assessment completed by Attending Physician prior to discharge.  Discharge destination:  Home  Is patient on multiple antipsychotic therapies at discharge:  No   Has Patient had three or more failed trials of antipsychotic monotherapy by history:  No  Recommended Plan for Multiple Antipsychotic Therapies: NA  Allergies as of 06/18/2020      Reactions   Amoxicillin Hives   Banana    Penicillins Hives   Childhood allergy-patient remembers taking Amoxicillin with no side effects. Did it involve swelling of the face/tongue/throat, SOB, or  low BP? No Did it involve sudden or severe rash/hives, skin peeling, or any reaction on the inside of your mouth or nose? No Did you need to seek medical attention at a hospital or doctor's office? Unknown When did it last happen?childhood If all above answers are "NO", may proceed with cephalosporin use.   Sulfa Antibiotics Nausea And Vomiting      Medication List    STOP taking these medications   OLANZapine 5 MG tablet Commonly known as: ZYPREXA     TAKE these medications     Indication  carbamazepine 100 MG chewable tablet Commonly known as: TEGRETOL Chew 2 tablets (200 mg total) by mouth 2 (two) times daily. For mood stabilization  Indication: Mood stabilization   fluPHENAZine 5 MG tablet Commonly known as: PROLIXIN Take 1 tablet (5 mg) by mouth in the morning & 2 tablets (10 mg) at bedtime: For mood control  Indication: Mood control   hydrOXYzine 25 MG tablet Commonly known as: ATARAX/VISTARIL Take 1 tablet (25 mg total) by mouth 3 (three) times daily as needed for anxiety.  Indication: Feeling Anxious   nicotine polacrilex 2 MG gum Commonly known as: NICORETTE Take 1 each (2 mg total) by mouth as needed. (May buy from over the counter): For smoking cessation  Indication: Nicotine Addiction   traZODone 50 MG tablet Commonly known as: DESYREL Take 1 tablet (50 mg total) by mouth at bedtime as needed for sleep.  Indication: Trouble Sleeping       Follow-up Information    [facility] Follow up on 06/24/2020.   Why: Your appointment is scheduled for Tuesday, 06/24/20 at 240pm. This will be a Virtual appointment, please be available between 2:00 pm to 4:00 pm for the call.   Contact information: [address],  [place], [place] [address] Ph: [phone] Fax:[phone]             Follow-up recommendations:  Activity:  As recommended by your primary care doctor. Diet:  As recommended by your primary care doctor.  Comments:  Prescriptions given at discharge.  Patient agreeable to plan.  Given opportunity to ask questions.  Appears to feel comfortable with discharge denies any current suicidal or homicidal thought. Patient is also instructed prior to discharge to: Take all medications as prescribed by his/her mental healthcare provider. Report any adverse effects and or reactions from the medicines to his/her outpatient provider promptly. Patient has been instructed & cautioned:  To not engage in alcohol and or illegal drug use while on prescription medicines. In the event of worsening symptoms, patient is instructed to call the crisis hotline, 911 and or go to the nearest ED for appropriate evaluation and treatment of symptoms. To follow-up with his/her primary care provider for your other medical issues, concerns and or health care needs.  Signed: Armandina Stammer, NP, PMHNP, FNP-BC 06/18/2020, 10:20 AM

## 2020-06-18 NOTE — Progress Notes (Signed)
Pt discharged to lobby. Pt was stable and appreciative at that time. All papers and prescriptions were given and valuables returned. Verbal understanding expressed. Denies SI/HI and A/VH. Pt given opportunity to express concerns and ask questions.  

## 2020-06-18 NOTE — Progress Notes (Signed)
Pt came back later and took her medications , but continued to try to Corporate investment banker.

## 2020-06-18 NOTE — [facility] Suicide Risk Assessment (Signed)
[facility] Discharge Suicide Risk Assessment   Principal Problem: <principal problem not specified> Discharge Diagnoses: Active Problems:   Affective psychosis, bipolar (HCC)   Total Time spent with patient: 15 minutes  Musculoskeletal: Strength & Muscle Tone: within normal limits Gait & Station: normal Patient leans: N/A  Psychiatric Specialty Exam: Review of Systems  Psychiatric/Behavioral: Positive for agitation and behavioral problems.  All other systems reviewed and are negative.   Blood pressure 105/68, pulse 80, temperature 98.3 F (36.8 C), temperature source Oral, resp. rate 16, height 5\' 7"  (1.702 m), weight 59.9 kg, last menstrual period 05/29/2020, SpO2 99 %.Body mass index is 20.67 kg/m.  General Appearance: Disheveled  Eye 07/29/2020::  Fair  Speech:  Normal Rate409  Volume:  Normal  Mood:  Irritable  Affect:  Congruent  Thought Process:  Coherent and Descriptions of Associations: Intact  Orientation:  Full (Time, Place, and Person)  Thought Content:  Rumination  Suicidal Thoughts:  No  Homicidal Thoughts:  No  Memory:  Immediate;   Fair Recent;   Fair Remote;   Fair  Judgement:  Intact  Insight:  Lacking  Psychomotor Activity:  Normal  Concentration:  Fair  Recall:  [phone] of Knowledge:Fair  Language: Good  Akathisia:  Negative  Handed:  Right  AIMS (if indicated):     Assets:  Desire for Improvement Resilience  Sleep:  Number of Hours: 6.5  Cognition: WNL  ADL's:  Intact   Mental Status Per Nursing Assessment::   On Admission:  NA  Demographic Factors:  Divorced or widowed, Caucasian, Low socioeconomic status, Living alone and Unemployed  Loss Factors: Financial problems/change in socioeconomic status  Historical Factors: Impulsivity  Risk Reduction Factors:   NA  Continued Clinical Symptoms:  Bipolar Disorder:   Mixed State Alcohol/Substance Abuse/Dependencies Unstable or Poor Therapeutic Relationship Previous Psychiatric Diagnoses  and Treatments  Cognitive Features That Contribute To Risk:  Thought constriction (tunnel vision)    Suicide Risk:  Minimal: No identifiable suicidal ideation.  Patients presenting with no risk factors but with morbid ruminations; may be classified as minimal risk based on the severity of the depressive symptoms   Follow-up Information    [facility] Follow up on 06/24/2020.   Why: Your appointment is scheduled for Tuesday, 06/24/20 at 240pm. This will be a Virtual appointment, please be available between 2:00 pm to 4:00 pm for the call.   Contact information: [address]  [place], [place] Ph: [phone] Fax:[phone]              Plan Of Care/Follow-up recommendations:  Activity:  ad lib Other:  take medications as directed  [id number], MD 06/18/2020, 9:16 AM

## 2020-06-18 NOTE — Progress Notes (Signed)
Pt was very Financial planner when medications were given, pt told Clinical research associate I don't take 2 antipsychotics, "what is this" . Pt was informed it was her Prolixin. Pt stated she too pink chewable pills earlier. Pt was informed that was Tegretol and it was not an Antipsychotic. Pt took the pills then spit them back into the medication cup and proceeded to walk away from the medication window. Pt was asked if she was going to take the medications. Pt did not Designer, multimedia.

## 2020-07-18 ENCOUNTER — Ambulatory Visit (HOSPITAL_COMMUNITY)
Admission: EM | Admit: 2020-07-18 | Discharge: 2020-07-19 | Disposition: A | Discharge status: Other Acute Inpatient Hospital | Payer: No Payment, Other | Attending: Licensed Clinical Social Worker | Admitting: Licensed Clinical Social Worker

## 2020-07-18 ENCOUNTER — Other Ambulatory Visit: Payer: Self-pay

## 2020-07-18 DIAGNOSIS — F319 Bipolar disorder, unspecified: Secondary | ICD-10-CM | POA: Insufficient documentation

## 2020-07-18 NOTE — ED Notes (Signed)
Patient belongings stored in locker 25 

## 2020-07-19 ENCOUNTER — Emergency Department (HOSPITAL_COMMUNITY)
Admission: EM | Admit: 2020-07-19 | Discharge: 2020-07-20 | Disposition: A | Discharge status: Home / Self Care | Payer: Self-pay | Attending: Emergency Medicine | Admitting: Emergency Medicine

## 2020-07-19 ENCOUNTER — Encounter (HOSPITAL_COMMUNITY): Payer: Self-pay | Admitting: Emergency Medicine

## 2020-07-19 DIAGNOSIS — Z20822 Contact with and (suspected) exposure to covid-19: Secondary | ICD-10-CM | POA: Insufficient documentation

## 2020-07-19 DIAGNOSIS — F1590 Other stimulant use, unspecified, uncomplicated: Secondary | ICD-10-CM | POA: Insufficient documentation

## 2020-07-19 DIAGNOSIS — F29 Unspecified psychosis not due to a substance or known physiological condition: Secondary | ICD-10-CM | POA: Insufficient documentation

## 2020-07-19 DIAGNOSIS — F319 Bipolar disorder, unspecified: Secondary | ICD-10-CM | POA: Diagnosis not present

## 2020-07-19 DIAGNOSIS — F17210 Nicotine dependence, cigarettes, uncomplicated: Secondary | ICD-10-CM | POA: Insufficient documentation

## 2020-07-19 LAB — COMPREHENSIVE METABOLIC PANEL
ALT: 171 U/L — ABNORMAL HIGH (ref 0–44)
AST: 28 U/L (ref 15–41)
Albumin: 4.6 g/dL (ref 3.5–5.0)
Alkaline Phosphatase: 65 U/L (ref 38–126)
Anion gap: 12 (ref 5–15)
BUN: 12 mg/dL (ref 6–20)
CO2: 21 mmol/L — ABNORMAL LOW (ref 22–32)
Calcium: 9.3 mg/dL (ref 8.9–10.3)
Chloride: 105 mmol/L (ref 98–111)
Creatinine, Ser: 0.68 mg/dL (ref 0.44–1.00)
GFR calc Af Amer: >60 mL/min (ref >60)
GFR calc non Af Amer: >60 mL/min (ref >60)
Glucose, Bld: 89 mg/dL (ref 70–99)
Potassium: 3.9 mmol/L (ref 3.5–5.1)
Sodium: 138 mmol/L (ref 135–145)
Total Bilirubin: 2 mg/dL — ABNORMAL HIGH (ref 0.3–1.2)
Total Protein: 7.3 g/dL (ref 6.5–8.1)

## 2020-07-19 LAB — CBC WITH DIFFERENTIAL/PLATELET
Abs Immature Granulocytes: 0.05 10*3/uL (ref 0.00–0.07)
Basophils Absolute: 0.1 10*3/uL (ref 0.0–0.1)
Basophils Relative: 1 %
Eosinophils Absolute: 0.3 10*3/uL (ref 0.0–0.5)
Eosinophils Relative: 3 %
HCT: 40.5 % (ref 36.0–46.0)
Hemoglobin: 13.8 g/dL (ref 12.0–15.0)
Immature Granulocytes: 0 %
Lymphocytes Relative: 34 %
Lymphs Abs: 4 10*3/uL (ref 0.7–4.0)
MCH: 31.7 pg (ref 26.0–34.0)
MCHC: 34.1 g/dL (ref 30.0–36.0)
MCV: 93.1 fL (ref 80.0–100.0)
Monocytes Absolute: 1.1 10*3/uL — ABNORMAL HIGH (ref 0.1–1.0)
Monocytes Relative: 9 %
Neutro Abs: 6.2 10*3/uL (ref 1.7–7.7)
Neutrophils Relative %: 53 %
Platelets: 271 10*3/uL (ref 150–400)
RBC: 4.35 MIL/uL (ref 3.87–5.11)
RDW: 12.4 % (ref 11.5–15.5)
WBC: 11.7 10*3/uL — ABNORMAL HIGH (ref 4.0–10.5)
nRBC: 0 % (ref 0.0–0.2)

## 2020-07-19 LAB — SALICYLATE LEVEL: Salicylate Lvl: <7 mg/dL — ABNORMAL LOW (ref 7.0–30.0)

## 2020-07-19 LAB — I-STAT BETA HCG BLOOD, ED (MC, WL, AP ONLY): I-stat hCG, quantitative: <5 m[IU]/mL (ref <5)

## 2020-07-19 LAB — RESPIRATORY PANEL BY RT PCR (FLU A&B, COVID)
Influenza A by PCR: NEGATIVE
Influenza B by PCR: NEGATIVE
SARS Coronavirus 2 by RT PCR: NEGATIVE

## 2020-07-19 LAB — ACETAMINOPHEN LEVEL: Acetaminophen (Tylenol), Serum: <10 ug/mL — ABNORMAL LOW (ref 10–30)

## 2020-07-19 LAB — ETHANOL: Alcohol, Ethyl (B): <10 mg/dL (ref <10)

## 2020-07-19 MED ORDER — NICOTINE 21 MG/24HR TD PT24: 21.0000 mg | MEDICATED_PATCH | Freq: Every day | TRANSDERMAL | Status: DC

## 2020-07-19 MED ORDER — OLANZAPINE 5 MG PO TBDP
5.0000 mg | ORAL_TABLET | Freq: Three times a day (TID) | ORAL | Status: DC | PRN
  Administered 2020-07-19 – 2020-07-20 (×2): 5 mg via ORAL
  Filled 2020-07-19 (×2): qty 1

## 2020-07-19 MED ORDER — STERILE WATER FOR INJECTION IJ SOLN
INTRAMUSCULAR | Status: AC
  Filled 2020-07-19: qty 10

## 2020-07-19 MED ORDER — ACETAMINOPHEN 325 MG PO TABS
650.0000 mg | ORAL_TABLET | ORAL | Status: DC | PRN
  Administered 2020-07-19: 650 mg via ORAL
  Filled 2020-07-19: qty 2

## 2020-07-19 MED ORDER — ALUM & MAG HYDROXIDE-SIMETH 200-200-20 MG/5ML PO SUSP: 30.0000 mL | Freq: Four times a day (QID) | ORAL | Status: DC | PRN

## 2020-07-19 MED ORDER — LORAZEPAM 1 MG PO TABS
1.0000 mg | ORAL_TABLET | Freq: Once | ORAL | Status: AC
  Administered 2020-07-20: 1 mg via ORAL
  Filled 2020-07-19: qty 1

## 2020-07-19 MED ORDER — ONDANSETRON HCL 4 MG PO TABS: 4.0000 mg | ORAL_TABLET | Freq: Three times a day (TID) | ORAL | Status: DC | PRN

## 2020-07-19 MED ORDER — LORAZEPAM 1 MG PO TABS
1.0000 mg | ORAL_TABLET | ORAL | Status: AC | PRN
  Administered 2020-07-19: 1 mg via ORAL
  Filled 2020-07-19: qty 1

## 2020-07-19 MED ORDER — ALBUTEROL SULFATE HFA 108 (90 BASE) MCG/ACT IN AERS
1.0000 | INHALATION_SPRAY | Freq: Four times a day (QID) | RESPIRATORY_TRACT | Status: DC
  Administered 2020-07-19: 2 via RESPIRATORY_TRACT
  Administered 2020-07-20: 1 via RESPIRATORY_TRACT
  Filled 2020-07-19 (×2): qty 6.7

## 2020-07-19 MED ORDER — ZIPRASIDONE MESYLATE 20 MG IM SOLR
10.0000 mg | Freq: Once | INTRAMUSCULAR | Status: AC
  Administered 2020-07-19: 10 mg via INTRAMUSCULAR
  Filled 2020-07-19: qty 20

## 2020-07-19 NOTE — ED Provider Notes (Addendum)
[facility]-EMERGENCY DEPT Provider Note   CSN: [id number] Arrival date & time: 07/19/20  0133  LEVEL 5 CAVEAT - ACUTE PSYCHOSIS History Chief Complaint  Patient presents with  . Medical Clearance    Carol Miles is a 29 y.o. female.  HPI 29 year old female sent over from behavioral health with acute psychosis.  The patient went to behavioral health because she states she needs to be kept safe.  The history is quite limited as she is acutely psychotic and often does not make sense.  She does tell me that people are after her and seems to indicate this has been going on a long time.  She states they are stalkers, and they know what she is going to do before she does.  She also complains of chronic diffuse body pain which is unchanged.  Tells me something about meth but it is hard to elucidate what exactly she is talking about besides using it recently.   Past Medical History:  Diagnosis Date  . Bipolar 1 disorder (HCC)   . Depression   . HPV (human papilloma virus) anogenital infection   . PTSD (post-traumatic stress disorder)     Patient Active Problem List   Diagnosis Date Noted  . Bipolar I disorder, most recent episode mixed (HCC) 06/18/2020  . Affective psychosis, bipolar (HCC) 06/10/2020  . Amphetamine use disorder, severe (HCC)   . Bipolar disorder (HCC) 05/09/2020  . History of bipolar disorder 05/18/2019    Past Surgical History:  Procedure Laterality Date  . HERNIA REPAIR       OB History   No obstetric history on file.     Family History  Problem Relation Age of Onset  . OCD Brother     Social History   Tobacco Use  . Smoking status: Current Every Day Smoker    Packs/day: 1.00    Years: 6.00    Pack years: 6.00    Types: Cigarettes  . Smokeless tobacco: Never Used  Vaping Use  . Vaping Use: Never used  Substance Use Topics  . Alcohol use: Yes    Comment: occ has a beer   . Drug use: Yes    Types: Methamphetamines,  Marijuana    Comment: heroin    Home Medications Prior to Admission medications   Medication Sig Start Date End Date Taking? Authorizing Provider  carbamazepine (TEGRETOL) 100 MG chewable tablet Chew 2 tablets (200 mg total) by mouth 2 (two) times daily. For mood stabilization 06/18/20   Armandina Stammer I, NP  fluPHENAZine (PROLIXIN) 5 MG tablet Take 1 tablet (5 mg) by mouth in the morning & 2 tablets (10 mg) at bedtime: For mood control 06/18/20   Armandina Stammer I, NP  hydrOXYzine (ATARAX/VISTARIL) 25 MG tablet Take 1 tablet (25 mg total) by mouth 3 (three) times daily as needed for anxiety. 06/18/20   Armandina Stammer I, NP  nicotine polacrilex (NICORETTE) 2 MG gum Take 1 each (2 mg total) by mouth as needed. (May buy from over the counter): For smoking cessation 06/18/20   Armandina Stammer I, NP  traZODone (DESYREL) 50 MG tablet Take 1 tablet (50 mg total) by mouth at bedtime as needed for sleep. 06/18/20   Armandina Stammer I, NP  lurasidone (LATUDA) 20 MG TABS tablet Take by mouth. 06/18/19 04/25/20  [provider]    Allergies    Amoxicillin, Banana, Penicillins, and Sulfa antibiotics  Review of Systems   Review of Systems  Unable to perform ROS:  Psychiatric disorder    Physical Exam Updated Vital Signs BP 93/79   Pulse 80   Temp 98.3 F (36.8 C) (Oral)   Resp 19   Ht 5\' 7"  (1.702 m)   SpO2 100%   BMI 4.70 kg/m   Physical Exam Vitals and nursing note reviewed.  Constitutional:      Appearance: She is well-developed.  HENT:     Head: Normocephalic and atraumatic.     Right Ear: External ear normal.     Left Ear: External ear normal.     Nose: Nose normal.  Eyes:     General:        Right eye: No discharge.        Left eye: No discharge.  Cardiovascular:     Rate and Rhythm: Normal rate and regular rhythm.     Heart sounds: Normal heart sounds.  Pulmonary:     Effort: Pulmonary effort is normal.     Breath sounds: Normal breath sounds.  Abdominal:     Palpations: Abdomen is  soft.     Tenderness: There is no abdominal tenderness.  Skin:    General: Skin is warm and dry.  Neurological:     Mental Status: She is alert.  Psychiatric:        Mood and Affect: Mood is not anxious.        Speech: Speech is tangential.        Thought Content: Thought content is paranoid.     Comments: Speaks very quietly     ED Results / Procedures / Treatments   Labs (all labs ordered are listed, but only abnormal results are displayed) Labs Reviewed  ACETAMINOPHEN LEVEL - Abnormal; Notable for the following components:      Result Value   Acetaminophen (Tylenol), Serum <10 (*)    All other components within normal limits  COMPREHENSIVE METABOLIC PANEL - Abnormal; Notable for the following components:   CO2 21 (*)    ALT 171 (*)    Total Bilirubin 2.0 (*)    All other components within normal limits  CBC WITH DIFFERENTIAL/PLATELET - Abnormal; Notable for the following components:   WBC 11.7 (*)    Monocytes Absolute 1.1 (*)    All other components within normal limits  SALICYLATE LEVEL - Abnormal; Notable for the following components:   Salicylate Lvl <7.0 (*)    All other components within normal limits  RESPIRATORY PANEL BY RT PCR (FLU A&B, COVID)  ETHANOL  RAPID URINE DRUG SCREEN, HOSP PERFORMED  I-STAT BETA HCG BLOOD, ED ([facility], [facility], [facility] ONLY)    EKG EKG Interpretation  Date/Time:  Saturday July 19 2020 03:23:18 EDT Ventricular Rate:  95 PR Interval:    QRS Duration: 94 QT Interval:  367 QTC Calculation: 462 R Axis:   78 Text Interpretation: Sinus rhythm nonspecific T waves Confirmed by 06-23-1986 [phone]) on 07/19/2020 3:58:00 AM   Radiology No results found.  Procedures .Critical Care Performed by: 09/18/2020, MD Authorized by: Pricilla Loveless, MD   Critical care provider statement:    Critical care time (minutes):  30   Critical care time was exclusive of:  Separately billable procedures and treating other patients   Critical care was  necessary to treat or prevent imminent or life-threatening deterioration of the following conditions:  CNS failure or compromise   Critical care was time spent personally by me on the following activities:  Discussions with consultants, evaluation of patient's response to treatment, examination of  patient, ordering and performing treatments and interventions, ordering and review of laboratory studies, ordering and review of radiographic studies, pulse oximetry, re-evaluation of patient's condition, obtaining history from patient or surrogate and review of old charts   (including critical care time)  Medications Ordered in ED Medications  ziprasidone (GEODON) injection 10 mg (10 mg Intramuscular Given 07/19/20 0244)  sterile water (preservative free) injection (  Given 07/19/20 0249)    ED Course  I have reviewed the triage vital signs and the nursing notes.  Pertinent labs & imaging results that were available during my care of the patient were reviewed by me and considered in my medical decision making (see chart for details).  Clinical Course as of Jul 19 316  Sat Jul 19, 2020  0224 Patient is now agitated, screaming. She will be given IM geodon   [SG]    Clinical Course User Index [SG] Pricilla Loveless, MD   MDM Rules/Calculators/A&P                          Patient appears medically stable. I have reviewed her labs, overall unremarkable. Mild leukocytosis but I doubt infection. She appears stable for psychiatric admission. Geodon has helped with her agitation.  The patient has been placed in psychiatric observation due to the need to provide a safe environment for the patient while obtaining psychiatric consultation and evaluation, as well as ongoing medical and medication management to treat the patient's condition.  The patient has been placed under full IVC at this time.  Final Clinical Impression(s) / ED Diagnoses Final diagnoses:  Psychosis, unspecified psychosis type [facility])     Rx / DC Orders ED Discharge Orders    None       Pricilla Loveless, MD 07/19/20 [id number]    Pricilla Loveless, MD 07/19/20 [phone]

## 2020-07-19 NOTE — ED Notes (Signed)
Patient continuing to have outbursts of anger and threatening staff, screaming in the hall. Security brought to bedside and patient verbally redirected.
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
## 2020-07-19 NOTE — ED Notes (Signed)
As per NP Gillermo Murdoch, pt needs transfer to [facility] for Medical Clearance, then accepted to [facility].

## 2020-07-19 NOTE — ED Provider Notes (Signed)
[facility] Medical Screening Exam  Patient Name: Carol Miles MRN: [id number] Date of Evaluation: 07/19/20 Chief Complaint:  Auditory Hallucinations Diagnosis: Bipolar d/o  History of Present illness: Carol Miles is a 29 y.o. female who presents unaccompanied to [facility] voluntarily via Patent examiner. The patient is a poor historian with difficulties answering questions appropriately. During the patient assessment, she is mumbling throughout her answers. The patient has had a few inpatient admissions at [facility] on 05/10/20 and 06/08/20. On tonight's admission, the patient was brought in due to bizarre behavior and a disorganized thought process. It was reported that when the patient arrived, she was trying to take her clothes off. The patient did voice she was bitten by a dog and showed the marks on her left forearm. The patient began lying on the floor, yelling and screaming at people who were not there. The patient admitted to using methamphetamine and stated, "I use it all day, every day." She says she has not slept in days and says she experiences night terrors. She admitted to auditory hallucinations of people talking. She says she recently attempted suicide by trying to overdose on acetaminophen. The patient does appear to be responding to internal and external stimuli. She is presenting with delusional thinking. The patient admits to auditory hallucinations. The patient denies any suicidal, homicidal, or self-harm ideations. The patient is presenting with psychotic and paranoid behaviors. During an encounter with the patient, she was unable to answer questions appropriately. The patient is currently requiring medical clearance prior to being admitted to [facility]. This Clinical research associate spoke to Dr. Gwenlyn Fudge at [facility] that the patient is requiring medical clearance.  Psychiatric Specialty Exam  Presentation  General Appearance:Bizarre;Disheveled  Eye  Contact:Fleeting;Poor  Speech:Pressured;Other (comment) (Hallucinations)  Speech Volume:Increased  Handedness:Right   Mood and Affect  Mood:Euphoric;Irritable  Affect:Inappropriate   Thought Process  Thought Processes:Disorganized  Descriptions of Associations:Loose  Orientation:Partial  Thought Content:Delusions;Illogical;Paranoid Ideation;Scattered  Hallucinations:Auditory Unable to express  Ideas of Reference:Delusions;Paranoia  Suicidal Thoughts:No  Homicidal Thoughts:No   Sensorium  Memory:Immediate Poor;Recent Poor;Remote Poor  Judgment:Poor  Insight:Lacking   Executive Functions  Concentration:Poor  Attention Span:Poor  Recall:Poor  Fund of Knowledge:Poor  Language:Poor   Psychomotor Activity  Psychomotor Activity:Increased   Assets  Assets:Desire for Improvement;Housing;Physical Health;Social Support   Sleep  Sleep:Poor  Number of hours: 2   Physical Exam: Physical Exam Vitals and nursing note reviewed.  Constitutional:      Appearance: Normal appearance.  HENT:     Right Ear: Tympanic membrane normal.     Left Ear: Tympanic membrane normal.     Nose: Nose normal.  Pulmonary:     Effort: Pulmonary effort is normal.  Musculoskeletal:        General: Normal range of motion.     Cervical back: Normal range of motion and neck supple.  Neurological:     Mental Status: She is alert. She is disoriented.    Review of Systems  Psychiatric/Behavioral: Positive for hallucinations and substance abuse. The patient is nervous/anxious.   All other systems reviewed and are negative.  Blood pressure 119/70, pulse 87, temperature 97.8 F (36.6 C), temperature source Tympanic, resp. rate 18, height 5\' 7"  (1.702 m), weight 30 lb (13.6 kg), SpO2 98 %. Body mass index is 4.7 kg/m.  Musculoskeletal: Strength & Muscle Tone: within normal limits Gait & Station: normal Patient leans: N/A   [facility] MSE Discharge Disposition for Follow up  and Recommendations: Based on my evaluation the patient  appears to have an emergency medical condition for which I recommend the patient be transferred to the emergency department for further evaluation.    Gillermo Murdoch, NP 07/19/2020, 12:27 AM

## 2020-07-19 NOTE — ED Notes (Signed)
EMS Dispatch called, [facility] transport requested.

## 2020-07-19 NOTE — ED Notes (Signed)
Pt intermittently yelling, "I need an inhaler, I have asthma" Pt spo2 99% on RA, speaking in full sentences.

## 2020-07-19 NOTE — ED Notes (Signed)
Put away Pt's belongings in a bag.

## 2020-07-19 NOTE — ED Notes (Signed)
Pt intermittently yelling out statements that do not make sense, redirectable.

## 2020-07-19 NOTE — ED Triage Notes (Addendum)
Pt here from [facility] for medical clearance before [facility] will be able to accept her. They state that she is psychotic. She has warned that she does not like to be alone with women. Per the [facility] nurse, [facility] said that they would be able to accept her tonight. A&Ox4.

## 2020-07-20 ENCOUNTER — Inpatient Hospital Stay (HOSPITAL_COMMUNITY)
Admission: AD | Admit: 2020-07-20 | Discharge: 2020-07-30 | DRG: 885 | Disposition: A | Discharge status: Home / Self Care | Payer: Federal, State, Local not specified - Other | Source: Intra-hospital | Attending: Psychiatry | Admitting: Psychiatry

## 2020-07-20 ENCOUNTER — Other Ambulatory Visit: Payer: Self-pay

## 2020-07-20 ENCOUNTER — Encounter (HOSPITAL_COMMUNITY): Payer: Self-pay | Admitting: Psychiatry

## 2020-07-20 ENCOUNTER — Other Ambulatory Visit: Payer: Self-pay | Admitting: Psychiatric/Mental Health

## 2020-07-20 DIAGNOSIS — F23 Brief psychotic disorder: Secondary | ICD-10-CM | POA: Diagnosis not present

## 2020-07-20 DIAGNOSIS — F17210 Nicotine dependence, cigarettes, uncomplicated: Secondary | ICD-10-CM | POA: Diagnosis present

## 2020-07-20 DIAGNOSIS — Z880 Allergy status to penicillin: Secondary | ICD-10-CM

## 2020-07-20 DIAGNOSIS — Z9151 Personal history of suicidal behavior: Secondary | ICD-10-CM | POA: Diagnosis not present

## 2020-07-20 DIAGNOSIS — F1310 Sedative, hypnotic or anxiolytic abuse, uncomplicated: Secondary | ICD-10-CM | POA: Diagnosis present

## 2020-07-20 DIAGNOSIS — Z9119 Patient's noncompliance with other medical treatment and regimen: Secondary | ICD-10-CM | POA: Diagnosis not present

## 2020-07-20 DIAGNOSIS — F4310 Post-traumatic stress disorder, unspecified: Secondary | ICD-10-CM | POA: Diagnosis present

## 2020-07-20 DIAGNOSIS — F1520 Other stimulant dependence, uncomplicated: Secondary | ICD-10-CM | POA: Diagnosis present

## 2020-07-20 DIAGNOSIS — Z91018 Allergy to other foods: Secondary | ICD-10-CM | POA: Diagnosis not present

## 2020-07-20 DIAGNOSIS — Z818 Family history of other mental and behavioral disorders: Secondary | ICD-10-CM

## 2020-07-20 DIAGNOSIS — F250 Schizoaffective disorder, bipolar type: Secondary | ICD-10-CM | POA: Diagnosis present

## 2020-07-20 DIAGNOSIS — Z5900 Homelessness unspecified: Secondary | ICD-10-CM

## 2020-07-20 DIAGNOSIS — F319 Bipolar disorder, unspecified: Secondary | ICD-10-CM | POA: Diagnosis present

## 2020-07-20 MED ORDER — OLANZAPINE 10 MG PO TBDP
20.0000 mg | ORAL_TABLET | Freq: Three times a day (TID) | ORAL | Status: DC | PRN
  Administered 2020-07-22: 20 mg via ORAL
  Filled 2020-07-20: qty 2

## 2020-07-20 MED ORDER — MAGNESIUM HYDROXIDE 400 MG/5ML PO SUSP: 30.0000 mL | Freq: Every day | ORAL | Status: DC | PRN

## 2020-07-20 MED ORDER — LORAZEPAM 2 MG/ML IJ SOLN: 2.0000 mg | Freq: Four times a day (QID) | INTRAMUSCULAR | Status: DC | PRN

## 2020-07-20 MED ORDER — FLUPHENAZINE HCL 5 MG PO TABS
5.0000 mg | ORAL_TABLET | Freq: Every day | ORAL | Status: DC
  Administered 2020-07-21 – 2020-07-23 (×3): 5 mg via ORAL
  Filled 2020-07-20 (×4): qty 1

## 2020-07-20 MED ORDER — ALUM & MAG HYDROXIDE-SIMETH 200-200-20 MG/5ML PO SUSP: 30.0000 mL | ORAL | Status: DC | PRN

## 2020-07-20 MED ORDER — ZIPRASIDONE MESYLATE 20 MG IM SOLR: 20.0000 mg | INTRAMUSCULAR | Status: DC | PRN

## 2020-07-20 MED ORDER — STERILE WATER FOR INJECTION IJ SOLN
INTRAMUSCULAR | Status: AC
  Administered 2020-07-20: 1.2 mL
  Filled 2020-07-20: qty 10

## 2020-07-20 MED ORDER — TRAZODONE HCL 50 MG PO TABS
50.0000 mg | ORAL_TABLET | Freq: Every evening | ORAL | Status: DC | PRN
  Administered 2020-07-26: 50 mg via ORAL
  Filled 2020-07-20: qty 1

## 2020-07-20 MED ORDER — OLANZAPINE 5 MG PO TBDP
5.0000 mg | ORAL_TABLET | Freq: Three times a day (TID) | ORAL | Status: DC | PRN
  Administered 2020-07-20: 5 mg via ORAL
  Filled 2020-07-20: qty 1

## 2020-07-20 MED ORDER — HYDROXYZINE HCL 25 MG PO TABS
25.0000 mg | ORAL_TABLET | Freq: Three times a day (TID) | ORAL | Status: DC | PRN
  Administered 2020-07-20 – 2020-07-28 (×6): 25 mg via ORAL
  Filled 2020-07-20 (×2): qty 1
  Filled 2020-07-20: qty 20
  Filled 2020-07-20 (×5): qty 1

## 2020-07-20 MED ORDER — LORAZEPAM 1 MG PO TABS
2.0000 mg | ORAL_TABLET | Freq: Every day | ORAL | Status: DC | PRN
  Administered 2020-07-20: 2 mg via ORAL
  Filled 2020-07-20: qty 2

## 2020-07-20 MED ORDER — BENZTROPINE MESYLATE 1 MG/ML IJ SOLN: 2.0000 mg | Freq: Two times a day (BID) | INTRAMUSCULAR | Status: DC | PRN

## 2020-07-20 MED ORDER — LORAZEPAM 1 MG PO TABS
2.0000 mg | ORAL_TABLET | Freq: Four times a day (QID) | ORAL | Status: DC | PRN
  Administered 2020-07-22 – 2020-07-29 (×9): 2 mg via ORAL
  Filled 2020-07-20 (×10): qty 2

## 2020-07-20 MED ORDER — LORAZEPAM 2 MG/ML IJ SOLN: 2.0000 mg | Freq: Every day | INTRAMUSCULAR | Status: DC | PRN

## 2020-07-20 MED ORDER — BENZTROPINE MESYLATE 1 MG PO TABS
2.0000 mg | ORAL_TABLET | Freq: Two times a day (BID) | ORAL | Status: DC | PRN
  Administered 2020-07-26: 2 mg via ORAL
  Filled 2020-07-20: qty 2

## 2020-07-20 MED ORDER — FLUPHENAZINE HCL 5 MG PO TABS
5.0000 mg | ORAL_TABLET | Freq: Two times a day (BID) | ORAL | Status: DC
  Administered 2020-07-20: 5 mg via ORAL
  Filled 2020-07-20 (×3): qty 1

## 2020-07-20 MED ORDER — ZIPRASIDONE MESYLATE 20 MG IM SOLR
20.0000 mg | Freq: Once | INTRAMUSCULAR | Status: AC
  Administered 2020-07-20: 20 mg via INTRAMUSCULAR
  Filled 2020-07-20: qty 20

## 2020-07-20 MED ORDER — LORAZEPAM 1 MG PO TABS
1.0000 mg | ORAL_TABLET | ORAL | Status: AC | PRN
  Administered 2020-07-21: 1 mg via ORAL
  Filled 2020-07-20: qty 1

## 2020-07-20 MED ORDER — ALBUTEROL SULFATE HFA 108 (90 BASE) MCG/ACT IN AERS: 1.0000 | INHALATION_SPRAY | RESPIRATORY_TRACT | Status: DC | PRN

## 2020-07-20 MED ORDER — ENSURE MAX PROTEIN PO LIQD
11.0000 [oz_av] | Freq: Three times a day (TID) | ORAL | Status: DC | PRN
  Filled 2020-07-20 (×3): qty 330

## 2020-07-20 MED ORDER — ACETAMINOPHEN 325 MG PO TABS: 650.0000 mg | ORAL_TABLET | Freq: Four times a day (QID) | ORAL | Status: DC | PRN

## 2020-07-20 MED ORDER — FLUPHENAZINE HCL 5 MG PO TABS
10.0000 mg | ORAL_TABLET | Freq: Every day | ORAL | Status: DC
  Administered 2020-07-23 – 2020-07-29 (×7): 10 mg via ORAL
  Filled 2020-07-20: qty 1
  Filled 2020-07-20: qty 28
  Filled 2020-07-20 (×5): qty 1
  Filled 2020-07-20: qty 28
  Filled 2020-07-20 (×3): qty 1
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
## 2020-07-20 NOTE — ED Notes (Signed)
Report given to the [facility] nurse Vareir, RN. Per nurse they are short of staff and the room is not ready for the patient at this time. The nurse will call me and let me know when they are ready for the patient.

## 2020-07-20 NOTE — ED Notes (Signed)
Patient crying and inconsolable stating auditory hallucinations that she can "feel in her drums." Patient thrashing in bed crying and distraught. MD notified for medication for patient comfort and safety.

## 2020-07-20 NOTE — Tx Team (Signed)
Initial Treatment Plan 07/20/2020 2:05 PM ROSHANNA CIMINO NGE:[id number]    PATIENT STRESSORS: Financial difficulties Health problems Medication change or noncompliance Substance abuse Traumatic event   PATIENT STRENGTHS: Communication skills Motivation for treatment/growth Physical Health Supportive family/friends   PATIENT IDENTIFIED PROBLEMS: anxiety  delusions  Substance use  Self harm thoughts               DISCHARGE CRITERIA:  Ability to meet basic life and health needs Improved stabilization in mood, thinking, and/or behavior Medical problems require only outpatient monitoring Motivation to continue treatment in a less acute level of care  PRELIMINARY DISCHARGE PLAN: Attend aftercare/continuing care group Participate in family therapy Placement in alternative living arrangements  PATIENT/FAMILY INVOLVEMENT: This treatment plan has been presented to and reviewed with the patient, DOMINIK LAURICELLA.  The patient and family have been given the opportunity to ask questions and make suggestions.  Raylene Miyamoto, RN 07/20/2020, 2:05 PM
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
## 2020-07-20 NOTE — ED Notes (Signed)
ED TO INPATIENT HANDOFF REPORT  ED Nurse Name and Phone #: [phone]  S Name/Age/Gender Carol Miles 29 y.o. female Room/Bed: WHALD/WHALD  Code Status   Code Status: Full Code  Home/SNF/Other Home Patient oriented to: self, place and time Is this baseline? Yes   Triage Complete: Triage complete  Chief Complaint medical clearance  Triage Note Pt here from [facility] for medical clearance before [facility] will be able to accept her. They state that she is psychotic. She has warned that she does not like to be alone with women. Per the [facility] nurse, [facility] said that they would be able to accept her tonight. A&Ox4.     Allergies Allergies  Allergen Reactions  . Amoxicillin Hives  . Banana   . Penicillins Hives    Childhood allergy-patient remembers taking Amoxicillin with no side effects. Did it involve swelling of the face/tongue/throat, SOB, or low BP? No Did it involve sudden or severe rash/hives, skin peeling, or any reaction on the inside of your mouth or nose? No Did you need to seek medical attention at a hospital or doctor's office? Unknown When did it last happen?childhood If all above answers are "NO", may proceed with cephalosporin use.   . Sulfa Antibiotics Nausea And Vomiting    Level of Care/Admitting Diagnosis ED Disposition    None      B Medical/Surgery History Past Medical History:  Diagnosis Date  . Bipolar 1 disorder (HCC)   . Depression   . HPV (human papilloma virus) anogenital infection   . PTSD (post-traumatic stress disorder)    Past Surgical History:  Procedure Laterality Date  . HERNIA REPAIR       A IV Location/Drains/Wounds Patient Lines/Drains/Airways Status    Active Line/Drains/Airways    None          Intake/Output Last 24 hours  Intake/Output Summary (Last 24 hours) at 07/20/2020 0842 Last data filed at 07/19/2020 1900 Gross per 24 hour  Intake 1320 ml  Output --  Net 1320 ml    Labs/Imaging Results for orders  placed or performed during the hospital encounter of 07/19/20 (from the past 48 hour(s))  Acetaminophen level     Status: Abnormal   Collection Time: 07/19/20  1:40 AM  Result Value Ref Range   Acetaminophen (Tylenol), Serum <10 (L) 10 - 30 ug/mL    Comment: (NOTE) Therapeutic concentrations vary significantly. A range of 10-30 ug/mL  may be an effective concentration for many patients. However, some  are best treated at concentrations outside of this range. Acetaminophen concentrations >150 ug/mL at 4 hours after ingestion  and >50 ug/mL at 12 hours after ingestion are often associated with  toxic reactions.  Performed at [facility], [address] [place], [place] [address]   Comprehensive metabolic panel     Status: Abnormal   Collection Time: 07/19/20  1:40 AM  Result Value Ref Range   Sodium 138 135 - 145 mmol/L   Potassium 3.9 3.5 - 5.1 mmol/L   Chloride 105 98 - 111 mmol/L   CO2 21 (L) 22 - 32 mmol/L   Glucose, Bld 89 70 - 99 mg/dL    Comment: Glucose reference range applies only to samples taken after fasting for at least 8 hours.   BUN 12 6 - 20 mg/dL   Creatinine, Ser 4.78 0.44 - 1.00 mg/dL   Calcium 9.3 8.9 - 29.5 mg/dL   Total Protein 7.3 6.5 - 8.1 g/dL   Albumin 4.6 3.5 -  5.0 g/dL   AST 28 15 - 41 U/L   ALT 171 (H) 0 - 44 U/L   Alkaline Phosphatase 65 38 - 126 U/L   Total Bilirubin 2.0 (H) 0.3 - 1.2 mg/dL   GFR calc non Af Amer >60 >60 mL/min   GFR calc Af Amer >60 >60 mL/min   Anion gap 12 5 - 15    Comment: Performed at [facility], [address], [place], [place] [address]  Ethanol     Status: None   Collection Time: 07/19/20  1:40 AM  Result Value Ref Range   Alcohol, Ethyl (B) <10 <10 mg/dL    Comment: (NOTE) Lowest detectable limit for serum alcohol is 10 mg/dL.  For medical purposes only. Performed at [facility], [address], [place], [place] [address]   CBC with Differential      Status: Abnormal   Collection Time: 07/19/20  1:40 AM  Result Value Ref Range   WBC 11.7 (H) 4.0 - 10.5 K/uL   RBC 4.35 3.87 - 5.11 MIL/uL   Hemoglobin 13.8 12.0 - 15.0 g/dL   HCT 29.5 36 - 46 %   MCV 93.1 80.0 - 100.0 fL   MCH 31.7 26.0 - 34.0 pg   MCHC 34.1 30.0 - 36.0 g/dL   RDW 28.4 13.2 - 44.0 %   Platelets 271 150 - 400 K/uL    Comment: REPEATED TO VERIFY   nRBC 0.0 0.0 - 0.2 %   Neutrophils Relative % 53 %   Neutro Abs 6.2 1.7 - 7.7 K/uL   Lymphocytes Relative 34 %   Lymphs Abs 4.0 0.7 - 4.0 K/uL   Monocytes Relative 9 %   Monocytes Absolute 1.1 (H) 0 - 1 K/uL   Eosinophils Relative 3 %   Eosinophils Absolute 0.3 0 - 0 K/uL   Basophils Relative 1 %   Basophils Absolute 0.1 0 - 0 K/uL   Immature Granulocytes 0 %   Abs Immature Granulocytes 0.05 0.00 - 0.07 K/uL    Comment: Performed at [facility], [address] [place], [place] [address]  Salicylate level     Status: Abnormal   Collection Time: 07/19/20  1:40 AM  Result Value Ref Range   Salicylate Lvl <7.0 (L) 7.0 - 30.0 mg/dL    Comment: Performed at [facility], [address] [place], [place] [address]  Respiratory Panel by RT PCR (Flu A&B, Covid) - Nasopharyngeal Swab     Status: None   Collection Time: 07/19/20  2:00 AM   Specimen: Nasopharyngeal Swab  Result Value Ref Range   SARS Coronavirus 2 by RT PCR NEGATIVE NEGATIVE    Comment: (NOTE) SARS-CoV-2 target nucleic acids are NOT DETECTED.  The SARS-CoV-2 RNA is generally detectable in upper respiratoy specimens during the acute phase of infection. The lowest concentration of SARS-CoV-2 viral copies this assay can detect is 131 copies/mL. A negative result does not preclude SARS-Cov-2 infection and should not be used as the sole basis for treatment or other patient management decisions. A negative result may occur with  improper specimen collection/handling, submission of specimen other than nasopharyngeal swab,  presence of viral mutation(s) within the areas targeted by this assay, and inadequate number of viral copies (<131 copies/mL). A negative result must be combined with clinical observations, patient history, and epidemiological information. The expected result is Negative.  Fact Sheet for Patients:  https://www.moore.com/  Fact Sheet for Healthcare Providers:  https://www.young.biz/  This test is no t yet approved or cleared by the [place] and  has been authorized for detection and/or diagnosis of SARS-CoV-2 by FDA under an Emergency Use Authorization (EUA). This EUA will remain  in effect (meaning this test can be used) for the duration of the COVID-19 declaration under Section 564(b)(1) of the Act, 21 U.S.C. section 360bbb-3(b)(1), unless the authorization is terminated or revoked sooner.     Influenza A by PCR NEGATIVE NEGATIVE   Influenza B by PCR NEGATIVE NEGATIVE    Comment: (NOTE) The Xpert Xpress SARS-CoV-2/FLU/RSV assay is intended as an aid in  the diagnosis of influenza from Nasopharyngeal swab specimens and  should not be used as a sole basis for treatment. Nasal washings and  aspirates are unacceptable for Xpert Xpress SARS-CoV-2/FLU/RSV  testing.  Fact Sheet for Patients: https://www.moore.com/  Fact Sheet for Healthcare Providers: https://www.young.biz/  This test is not yet approved or cleared by the [place] FDA and  has been authorized for detection and/or diagnosis of SARS-CoV-2 by  FDA under an Emergency Use Authorization (EUA). This EUA will remain  in effect (meaning this test can be used) for the duration of the  Covid-19 declaration under Section 564(b)(1) of the Act, 21  U.S.C. section 360bbb-3(b)(1), unless the authorization is  terminated or revoked. Performed at [facility], [address], [place], [place] [address]   I-Stat beta hCG  blood, ED     Status: None   Collection Time: 07/19/20  2:25 AM  Result Value Ref Range   I-stat hCG, quantitative <5.0 <5 mIU/mL   Comment 3            Comment:   GEST. AGE      CONC.  (mIU/mL)   <=1 WEEK        5 - 50     2 WEEKS       50 - 500     3 WEEKS       100 - 10,000     4 WEEKS     1,000 - 30,000        FEMALE AND NON-PREGNANT FEMALE:     LESS THAN 5 mIU/mL    No results found.  Pending Labs Unresulted Labs (From admission, onward)          Start     Ordered   07/19/20 0140  Urine rapid drug screen (hosp performed)  ONCE - STAT,   STAT        07/19/20 0140          Vitals/Pain Today's Vitals   07/20/20 0817 07/20/20 0818 07/20/20 0838 07/20/20 0840  BP: (!) 108/50 (!) 108/50  (!) 108/50  Pulse: 73 73  73  Resp:    18  Temp: 98 F (36.7 C) 98 F (36.7 C)  98 F (36.7 C)  TempSrc:  Oral  Oral  SpO2: 98% 98%  98%  Weight:      Height:      PainSc:   0-No pain     Isolation Precautions No active isolations  Medications Medications  OLANZapine zydis (ZYPREXA) disintegrating tablet 5 mg (5 mg Oral Given 07/20/20 0015)    And  LORazepam (ATIVAN) tablet 1 mg (1 mg Oral Given 07/19/20 1610)  ondansetron (ZOFRAN) tablet 4 mg (has no administration in time range)  alum & mag hydroxide-simeth (MAALOX/MYLANTA) 200-200-20 MG/5ML suspension 30 mL (has no administration in time range)  acetaminophen (TYLENOL) tablet 650 mg (650  mg Oral Given 07/19/20 1338)  albuterol (VENTOLIN HFA) 108 (90 Base) MCG/ACT inhaler 1-2 puff (has no administration in time range)  ziprasidone (GEODON) injection 10 mg (10 mg Intramuscular Given 07/19/20 0244)  sterile water (preservative free) injection (  Given 07/19/20 0249)  LORazepam (ATIVAN) tablet 1 mg (1 mg Oral Given 07/20/20 0015)  ziprasidone (GEODON) injection 20 mg (20 mg Intramuscular Given 07/20/20 0055)  sterile water (preservative free) injection (1.2 mLs  Given 07/20/20 0103)    Mobility walks Moderate fall risk    Focused Assessments .   R Recommendations: See Admitting Provider Note  Report given to:   Additional Notes: n/a

## 2020-07-20 NOTE — ED Notes (Signed)
Transportation has been call to transport patient [facility].

## 2020-07-20 NOTE — Progress Notes (Signed)
Patient presents with tangential speech and delusional thought content. She was difficult to follow during the assessment and required redirecting to help answer the questions. She reports AVH. She stated, "I hear and see things. I hear something bursting my ear drum." She then walked off making irrelevant statements.  Orders reviewed. Verbal support provided. 15 minute checks performed for safety.  Patient compliant with taking medications tonight. No medication side effects noted.

## 2020-07-20 NOTE — ED Provider Notes (Signed)
Emergency Medicine Observation Re-evaluation Note  Carol Miles is a 29 y.o. female, seen on rounds today.  Pt initially presented to the ED for complaints of Medical Clearance Currently, the patient is sleeping.  Physical Exam  BP (!) 108/50 (BP Location: Left Arm)   Pulse 73   Temp 98 F (36.7 C) (Oral)   Resp 18   Ht 5\' 7"  (1.702 m)   Wt 59.9 kg   SpO2 98%   BMI 20.68 kg/m  Physical Exam General: NAD Cardiac: normal rate Lungs: no increased WOB Psych: sleeping  ED Course / MDM  EKG:EKG Interpretation  Date/Time:  Saturday July 19 2020 03:23:18 EDT Ventricular Rate:  95 PR Interval:    QRS Duration: 94 QT Interval:  367 QTC Calculation: 462 R Axis:   78 Text Interpretation: Sinus rhythm nonspecific T waves Confirmed by 06-23-1986 [phone]) on 07/19/2020 3:58:00 AM  Clinical Course as of Jul 20 1012  Sat Jul 19, 2020  0224 Patient is now agitated, screaming. She will be given IM geodon   [SG]    Clinical Course User Index [SG] 0225, MD   I have reviewed the labs performed to date as well as medications administered while in observation.  Recent changes in the last 24 hours include none.  Plan  Current plan is for awaiting bed at [facility]. Patient is under full IVC at this time.   [facility], MD 07/20/20 1015

## 2020-07-20 NOTE — ED Notes (Signed)
Patient noted to be responding heavily to internal stimuli. Patient having full conversations with pause to answer. Patient also noted to be delusional stating that a dog was going to eat her alive. Patient very tangential with loose associations. Patient noted to be very impulsive.

## 2020-07-21 DIAGNOSIS — F1520 Other stimulant dependence, uncomplicated: Secondary | ICD-10-CM | POA: Diagnosis not present

## 2020-07-21 LAB — RAPID URINE DRUG SCREEN, HOSP PERFORMED
Amphetamines: NOT DETECTED
Barbiturates: NOT DETECTED
Benzodiazepines: NOT DETECTED
Cocaine: NOT DETECTED
Opiates: NOT DETECTED
Tetrahydrocannabinol: NOT DETECTED

## 2020-07-21 MED ORDER — ENSURE ENLIVE PO LIQD
237.0000 mL | Freq: Two times a day (BID) | ORAL | Status: DC
  Administered 2020-07-21 – 2020-07-28 (×13): 237 mL via ORAL

## 2020-07-21 MED ORDER — OXCARBAZEPINE 150 MG PO TABS
150.0000 mg | ORAL_TABLET | Freq: Two times a day (BID) | ORAL | Status: DC
  Administered 2020-07-21 – 2020-07-22 (×4): 150 mg via ORAL
  Filled 2020-07-21 (×8): qty 1

## 2020-07-21 NOTE — Tx Team (Signed)
Interdisciplinary Treatment and Diagnostic Plan Update  07/21/2020 Time of Session: 10:08AM Carol Miles MRN: [id number]  Principal Diagnosis: Amphetamine use disorder, severe ([place])  Secondary Diagnoses: Principal Problem:   Amphetamine use disorder, severe ([facility]) Active Problems:   Affective psychosis, bipolar ([place])   Acute psychosis ([place])   Current Medications:  Current Facility-Administered Medications  Medication Dose Route Frequency Provider Last Rate Last Admin  . acetaminophen (TYLENOL) tablet 650 mg  650 mg Oral Q6H PRN Connye Burkitt, NP      . alum & mag hydroxide-simeth (MAALOX/MYLANTA) 200-200-20 MG/5ML suspension 30 mL  30 mL Oral Q4H PRN Connye Burkitt, NP      . benztropine (COGENTIN) tablet 2 mg  2 mg Oral BID PRN Lavella Hammock, MD       Or  . benztropine mesylate (COGENTIN) injection 2 mg  2 mg Intramuscular BID PRN Lavella Hammock, MD      . feeding supplement (ENSURE ENLIVE) (ENSURE ENLIVE) liquid 237 mL  237 mL Oral BID BM Sharma Covert, MD   237 mL at 07/21/20 0951  . fluPHENAZine (PROLIXIN) tablet 10 mg  10 mg Oral QHS Lavella Hammock, MD      . fluPHENAZine (PROLIXIN) tablet 5 mg  5 mg Oral Daily Lavella Hammock, MD   5 mg at 07/21/20 0951  . hydrOXYzine (ATARAX/VISTARIL) tablet 25 mg  25 mg Oral TID PRN Connye Burkitt, NP   25 mg at 07/20/20 1844  . LORazepam (ATIVAN) tablet 2 mg  2 mg Oral Q6H PRN Lavella Hammock, MD       Or  . LORazepam (ATIVAN) injection 2 mg  2 mg Intramuscular Q6H PRN Lavella Hammock, MD      . OLANZapine zydis [facility]) disintegrating tablet 20 mg  20 mg Oral Q8H PRN Lavella Hammock, MD       And  . LORazepam (ATIVAN) tablet 1 mg  1 mg Oral PRN Lavella Hammock, MD       And  . ziprasidone (GEODON) injection 20 mg  20 mg Intramuscular PRN Lavella Hammock, MD      . magnesium hydroxide (MILK OF MAGNESIA) suspension 30 mL  30 mL Oral Daily PRN Connye Burkitt, NP      . OLANZapine zydis (ZYPREXA) disintegrating tablet  5 mg  5 mg Oral TID PRN Connye Burkitt, NP   5 mg at 07/20/20 1844  . OXcarbazepine (TRILEPTAL) tablet 150 mg  150 mg Oral BID Sharma Covert, MD   150 mg at 07/21/20 0951  . traZODone (DESYREL) tablet 50 mg  50 mg Oral QHS PRN Connye Burkitt, NP       PTA Medications: Medications Prior to Admission  Medication Sig Dispense Refill Last Dose  . carbamazepine (TEGRETOL) 100 MG chewable tablet Chew 2 tablets (200 mg total) by mouth 2 (two) times daily. For mood stabilization (Patient not taking: Reported on 07/19/2020) 60 tablet 0   . fluPHENAZine (PROLIXIN) 5 MG tablet Take 1 tablet (5 mg) by mouth in the morning & 2 tablets (10 mg) at bedtime: For mood control (Patient not taking: Reported on 07/19/2020) 90 tablet 0   . hydrOXYzine (ATARAX/VISTARIL) 25 MG tablet Take 1 tablet (25 mg total) by mouth 3 (three) times daily as needed for anxiety. (Patient not taking: Reported on 07/19/2020) 75 tablet 0   . nicotine polacrilex (NICORETTE) 2 MG gum Take 1 each (2 mg total) by mouth as  needed. (May buy from over the counter): For smoking cessation (Patient not taking: Reported on 07/19/2020) 100 tablet 0   . traZODone (DESYREL) 50 MG tablet Take 1 tablet (50 mg total) by mouth at bedtime as needed for sleep. (Patient not taking: Reported on 07/19/2020) 30 tablet 0     Patient Stressors:    Patient Strengths:    Treatment Modalities: Medication Management, Group therapy, Case management,  1 to 1 session with clinician, Psychoeducation, Recreational therapy.   Physician Treatment Plan for Primary Diagnosis: Amphetamine use disorder, severe ([name]) Long Term Goal(s): Improvement in symptoms so as ready for discharge Improvement in symptoms so as ready for discharge   Short Term Goals: Ability to identify changes in lifestyle to reduce recurrence of condition will improve Ability to verbalize feelings will improve Ability to disclose and discuss suicidal ideas Ability to demonstrate self-control will  improve Ability to identify and develop effective coping behaviors will improve Ability to maintain clinical measurements within normal limits will improve Compliance with prescribed medications will improve Ability to identify triggers associated with substance abuse/mental health issues will improve Ability to identify changes in lifestyle to reduce recurrence of condition will improve Ability to verbalize feelings will improve Ability to disclose and discuss suicidal ideas Ability to demonstrate self-control will improve Ability to identify and develop effective coping behaviors will improve Ability to maintain clinical measurements within normal limits will improve Compliance with prescribed medications will improve Ability to identify triggers associated with substance abuse/mental health issues will improve  Medication Management: Evaluate patient's response, side effects, and tolerance of medication regimen.  Therapeutic Interventions: 1 to 1 sessions, Unit Group sessions and Medication administration.  Evaluation of Outcomes: Not Met  Physician Treatment Plan for Secondary Diagnosis: Principal Problem:   Amphetamine use disorder, severe ([name]) Active Problems:   Affective psychosis, bipolar ([name])   Acute psychosis ([name])  Long Term Goal(s): Improvement in symptoms so as ready for discharge Improvement in symptoms so as ready for discharge   Short Term Goals: Ability to identify changes in lifestyle to reduce recurrence of condition will improve Ability to verbalize feelings will improve Ability to disclose and discuss suicidal ideas Ability to demonstrate self-control will improve Ability to identify and develop effective coping behaviors will improve Ability to maintain clinical measurements within normal limits will improve Compliance with prescribed medications will improve Ability to identify triggers associated with substance abuse/mental health issues will  improve Ability to identify changes in lifestyle to reduce recurrence of condition will improve Ability to verbalize feelings will improve Ability to disclose and discuss suicidal ideas Ability to demonstrate self-control will improve Ability to identify and develop effective coping behaviors will improve Ability to maintain clinical measurements within normal limits will improve Compliance with prescribed medications will improve Ability to identify triggers associated with substance abuse/mental health issues will improve     Medication Management: Evaluate patient's response, side effects, and tolerance of medication regimen.  Therapeutic Interventions: 1 to 1 sessions, Unit Group sessions and Medication administration.  Evaluation of Outcomes: Not Met   RN Treatment Plan for Primary Diagnosis: Amphetamine use disorder, severe ([place]) Long Term Goal(s): Knowledge of disease and therapeutic regimen to maintain health will improve  Short Term Goals: Ability to remain free from injury will improve, Ability to demonstrate self-control, Ability to participate in decision making will improve, Ability to verbalize feelings will improve, Ability to identify and develop effective coping behaviors will improve and Compliance with prescribed medications will improve  Medication Management: RN  will administer medications as ordered by provider, will assess and evaluate patient's response and provide education to patient for prescribed medication. RN will report any adverse and/or side effects to prescribing provider.  Therapeutic Interventions: 1 on 1 counseling sessions, Psychoeducation, Medication administration, Evaluate responses to treatment, Monitor vital signs and CBGs as ordered, Perform/monitor CIWA, COWS, AIMS and Fall Risk screenings as ordered, Perform wound care treatments as ordered.  Evaluation of Outcomes: Not Met   LCSW Treatment Plan for Primary Diagnosis: Amphetamine use  disorder, severe ([place]) Long Term Goal(s): Safe transition to appropriate next level of care at discharge, Engage patient in therapeutic group addressing interpersonal concerns.  Short Term Goals: Engage patient in aftercare planning with referrals and resources, Increase ability to appropriately verbalize feelings, Increase emotional regulation, Facilitate patient progression through stages of change regarding substance use diagnoses and concerns, Identify triggers associated with mental health/substance abuse issues and Increase skills for wellness and recovery  Therapeutic Interventions: Assess for all discharge needs, 1 to 1 time with Social worker, Explore available resources and support systems, Assess for adequacy in community support network, Educate family and significant other(s) on suicide prevention, Complete Psychosocial Assessment, Interpersonal group therapy.  Evaluation of Outcomes: Not Met   Progress in Treatment: Attending groups: No. Participating in groups: No. and As evidenced by:  recently admitted.  Taking medication as prescribed: Yes. Toleration medication: Yes. Family/Significant other contact made: No, will contact:  SW will discuss with Pt during assessment. Patient understands diagnosis: No. Discussing patient identified problems/goals with staff: Yes. Medical problems stabilized or resolved: Yes. Denies suicidal/homicidal ideation: Yes. Issues/concerns per patient self-inventory: No. Other: None  New problem(s) identified: Yes, Describe:  Pt readmitted due to bizzare behavior and mood disregulation. Pt has hisotry of admission and discharged in late August. Pt continues to fail to follow through with reccomened services at discharge.  New Short Term/Long Term Goal(s): SW will meet with Pt for completion of psychosocial assessment. Pt assess via face to face interactions with staff for changes and improvements in thought process. Pt encouraged to attend groups to  learn new and transferable coping skills.  Patient Goals: "I'm here to talk to a doctor, not talk to a social worker"  Discharge Plan or Barriers: SW will continue to assess.  Reason for Continuation of Hospitalization: Aggression Delusions  Medication stabilization  Estimated Length of Stay: 3-5 Days  Attendees: Patient: Carol Miles 07/21/2020 10:45 AM  Physician: Dr. Myles Lipps 07/21/2020 10:45 AM  Nursing:  07/21/2020 10:45 AM  RN Care Manager: 07/21/2020 10:45 AM  Social Worker: Freddi Che, LCSW 07/21/2020 10:45 AM  Recreational Therapist:  07/21/2020 10:45 AM  Other:  07/21/2020 10:45 AM  Other:  07/21/2020 10:45 AM  Other: 07/21/2020 10:45 AM    Scribe for Treatment Team: Freddi Che, LCSW 07/21/2020 10:45 AM

## 2020-07-21 NOTE — Progress Notes (Signed)
   07/21/20 2200  Psych Admission Type (Psych Patients Only)  Admission Status Involuntary  Psychosocial Assessment  Patient Complaints Anxiety  Eye Contact Brief  Facial Expression Pensive  Affect Anxious;Preoccupied;Inconsistent with thought content  Consulting civil engineer Activity Fidgety;Restless  Appearance/Hygiene In scrubs  Behavior Characteristics Cooperative  Mood Anxious  Thought Process  Coherency Tangential;Disorganized  Content Confabulation;Preoccupation  Delusions Grandeur;Persecutory  Perception Hallucinations  Hallucination Auditory;Visual  Judgment Impaired  Confusion Mild  Danger to Self  Current suicidal ideation? Denies  Self-Injurious Behavior No self-injurious ideation or behavior indicators observed or expressed   Danger to Others  Danger to Others None reported or observed

## 2020-07-21 NOTE — [facility] Group Notes (Signed)
LCSW Group Therapy Notes   Type of Therapy and Topic:Group Therapy:Healthy and Unhealthy Supports  Date and Time:07/21/20 and 1:00pm  Participation Level:[facility] PARTICIPATION LEVEL: Did not attend   Description of Group:Patients in this group were introduced to the idea of adding a variety of healthy supports to address the various needs in their lives. Patients discussed what additional healthy supports could be helpful in their recovery and wellness after discharge in order to prevent future hospitalizations. An emphasis was placed on using counselor, doctor, therapy groups, 12-step groups, and problem-specific support groups to expand supports. They also worked as a group on developing a specific plan for several patients to deal with unhealthy supports through boundary-setting, psychoeducation with loved ones, and even termination of relationships.  Therapeutic Goals:  1) discuss importance of adding supports to stay well once out of the hospital 2) compare healthy versus unhealthy supports and identify some examples of each 3) generate ideas and descriptions of healthy supports that can be added 4) offer mutual support about how to address unhealthy supports 5) encourage active participation in and adherence to discharge plan   Summary of Patient Progress:This patient was invited to attend group by CSW, however patient chose not to attend.   Therapeutic Modalities:  Motivational Interviewing Brief Solution-Focused Therapy   Vadis Slabach MSW, LCSW Clincal Social Worker  [facility] 

## 2020-07-21 NOTE — Progress Notes (Signed)
Recreation Therapy Notes  Date: 10.4.21 Time: 1000 Location: 500 Hall Dayroom  Group Topic: Coping Skills  Goal Area(s) Addresses:  Patient will identify positive coping skills. Patient will identify benefit of using coping skills post d/c.  Intervention: Worksheet  Activity: Healthy vs. Unhealthy Coping Strategies.  Patients were to identify a problem they are currently facing.  Patients were to then identify what healthy and unhealthy coping strategies used and the outcomes.  Education: Pharmacologist, Building control surveyor.   Education Outcome: Acknowledges understanding/In group clarification offered/Needs additional education.   Clinical Observations/Feedback: Pt did not attend group session.     Caroll Rancher, LRT/CTRS        Caroll Rancher A 07/21/2020 12:02 PM

## 2020-07-21 NOTE — Progress Notes (Signed)
Urine specimen collected tonight for UDS.
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
## 2020-07-21 NOTE — [facility] Counselor (Signed)
CSW attempted to complete PSA, however this patient refused assessment and requested CSW to "get the f*ck out."  CSW will attempt PSA at a later time.   Ruthann Cancer MSW, LCSW Clincal Social Worker  [facility]

## 2020-07-21 NOTE — Progress Notes (Signed)
   07/21/20 1500  Psych Admission Type (Psych Patients Only)  Admission Status Involuntary  Psychosocial Assessment  Patient Complaints Anxiety  Eye Contact Brief  Facial Expression Pensive  Affect Anxious;Preoccupied;Inconsistent with thought content  Consulting civil engineer Activity Fidgety;Restless  Appearance/Hygiene In scrubs  Behavior Characteristics Cooperative  Mood Anxious  Thought Process  Coherency Tangential;Disorganized  Content Confabulation;Preoccupation  Delusions Grandeur;Persecutory  Perception Hallucinations  Hallucination Auditory;Visual  Judgment Impaired  Confusion Mild  Danger to Self  Current suicidal ideation? Denies  Self-Injurious Behavior No self-injurious ideation or behavior indicators observed or expressed   Danger to Others  Danger to Others None reported or observed

## 2020-07-22 DIAGNOSIS — F1520 Other stimulant dependence, uncomplicated: Secondary | ICD-10-CM | POA: Diagnosis not present

## 2020-07-22 MED ORDER — FLUPHENAZINE DECANOATE 25 MG/ML IJ SOLN
25.0000 mg | Freq: Once | INTRAMUSCULAR | Status: AC
  Administered 2020-07-22: 25 mg via INTRAMUSCULAR
  Filled 2020-07-22: qty 1

## 2020-07-22 NOTE — [facility] Suicide Risk Assessment (Signed)
[facility] INPATIENT:  Family/Significant Other Suicide Prevention Education   Suicide Prevention Education:  Patient provided consent for Family/Significant Other Suicide Prevention Education: w/ Mary Sella [phone]), however CSW does not feel comfortable completing SPE with this individual based on patients history with this individual and not being clinically appropriate.    SPE completed with patient.  SPI pamphlet provided to pt and pt was encouraged to share information with support network, ask questions, and talk about any concerns relating to SPE. Patient denies access to guns/firearms and verbalized understanding of information provided. Mobile Crisis information also provided to patient.   Ruthann Cancer MSW, LCSW Clincal Social Worker  [facility]
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
## 2020-07-22 NOTE — Progress Notes (Signed)
Pt sleep much of the evening, pt received PRNs earlier in the evening.    07/22/20 2000  Psych Admission Type (Psych Patients Only)  Admission Status Involuntary  Psychosocial Assessment  Patient Complaints Suspiciousness;Irritability  Eye Contact Brief  Facial Expression Pensive  Affect Anxious;Preoccupied;Inconsistent with thought content  Consulting civil engineer Activity Fidgety;Restless  Appearance/Hygiene In scrubs  Behavior Characteristics Unwilling to participate  Mood Labile  Thought Process  Coherency Tangential;Disorganized  Content Confabulation;Preoccupation  Delusions Grandeur;Persecutory  Perception Hallucinations  Hallucination Auditory;Visual  Judgment Impaired  Confusion Mild  Danger to Self  Current suicidal ideation? Denies  Self-Injurious Behavior No self-injurious ideation or behavior indicators observed or expressed   Danger to Others  Danger to Others None reported or observed

## 2020-07-22 NOTE — [facility] Counselor (Signed)
CSW attempted to complete PSA with patient, however this patient was sleeping and could not be woken by CSW.   CSW will attempt to complete PSA at a later time.    Ruthann Cancer MSW, LCSW Clincal Social Worker  [facility]

## 2020-07-23 DIAGNOSIS — F1520 Other stimulant dependence, uncomplicated: Secondary | ICD-10-CM | POA: Diagnosis not present

## 2020-07-23 MED ORDER — NICOTINE POLACRILEX 2 MG MT GUM
2.0000 mg | CHEWING_GUM | OROMUCOSAL | Status: DC | PRN
  Administered 2020-07-23 – 2020-07-29 (×15): 2 mg via ORAL
  Filled 2020-07-23 (×2): qty 1
  Filled 2020-07-23: qty 9
  Filled 2020-07-23 (×3): qty 1

## 2020-07-23 MED ORDER — NICOTINE POLACRILEX 2 MG MT GUM
CHEWING_GUM | OROMUCOSAL | Status: AC
  Filled 2020-07-23: qty 10

## 2020-07-23 MED ORDER — OXCARBAZEPINE 150 MG PO TABS
150.0000 mg | ORAL_TABLET | Freq: Every day | ORAL | Status: DC
  Administered 2020-07-23: 150 mg via ORAL
  Filled 2020-07-23 (×2): qty 1

## 2020-07-23 MED ORDER — FLUPHENAZINE HCL 2.5 MG PO TABS
2.5000 mg | ORAL_TABLET | Freq: Every day | ORAL | Status: DC
  Administered 2020-07-24: 2.5 mg via ORAL
  Filled 2020-07-23 (×2): qty 1

## 2020-07-23 NOTE — [facility] Group Notes (Signed)
Patient did not attend orientation group.  

## 2020-07-23 NOTE — Plan of Care (Signed)
Progress note  D: pt found in bed; compliant with medication administration. Pt didn't awake until lunch time, stating they feel as though they have slept too much. Pt ate lunch and went back to sleep though. Pt seems anxious and fidgety on approach. Pt denies any physical pain or concerns. Pt denies si/hi/ah/vh and verbally agrees to approach staff if these become apparent or before harming themself/others while at [facility].  A: Pt provided support and encouragement. Pt given medication per protocol and standing orders. Q53m safety checks implemented and continued.  R: Pt safe on the unit. Will continue to monitor.  Pt progressing in the following metrics  Problem: Education: Goal: Ability to state activities that reduce stress will improve Outcome: Progressing   Problem: Self-Concept: Goal: Level of anxiety will decrease Outcome: Progressing Goal: Ability to modify response to factors that promote anxiety will improve Outcome: Progressing   Problem: Education: Goal: Understanding of discharge needs will improve Outcome: Progressing

## 2020-07-23 NOTE — [facility] Group Notes (Signed)
[facility] LCSW Group Therapy  07/23/2020 2:30 PM  Type of Therapy:  Discharge Planning  Participation Level:  Did Not Attend  Participation Quality:  Did not attend  Affect:  Did not attend  Cognitive:  Did not attend  Insight:  Did not attend  Engagement in Therapy:  Did not attend  Modes of Intervention:  Did not attend  Summary of Progress/Problems: This patient was invited to attend group by CSW, however declined attendance.  Khailee Mick A Nicolena Schurman 07/23/2020, 2:30 PM  
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
## 2020-07-23 NOTE — Progress Notes (Signed)
Adult Psychoeducational Group Note  Date:  07/23/2020 Time:  2:37 AM  Group Topic/Focus:  Wrap-Up Group:   The focus of this group is to help patients review their daily goal of treatment and discuss progress on daily workbooks.  Participation Level:  Did Not Attend  Participation Quality:  Did Not Attend  Affect:  Did Not Attend  Cognitive:  Did Not Attend  Insight: None  Engagement in Group:  Did Not Attend  Modes of Intervention:  Did Not Attend  Additional Comments:  Pt did not attend evening wrap up group.  Felipa Furnace 07/23/2020, 2:37 AM

## 2020-07-23 NOTE — Progress Notes (Signed)
   07/23/20 2000  Psych Admission Type (Psych Patients Only)  Admission Status Involuntary  Psychosocial Assessment  Patient Complaints Anxiety  Eye Contact Brief  Facial Expression Anxious;Pensive;Worried  Affect Anxious;Apprehensive;Preoccupied  Speech Press photographer;Restless  Appearance/Hygiene Improved  Behavior Characteristics Cooperative;Anxious  Mood Anxious;Preoccupied  Thought Process  Coherency Concrete thinking  Content Preoccupation;Paranoia  Delusions Paranoid  Perception Hallucinations  Hallucination Auditory;Visual  Judgment Poor  Confusion None  Danger to Self  Current suicidal ideation? Passive  Agreement Not to Harm Self Yes  Description of Agreement pt verbally agrees to approach staff before harming self/others while at [facility]  Danger to Others  Danger to Others None reported or observed   Pt endorses AVH saying that she is seeing flashbacks like PTSD. Pt endorses passive SI but contracts for safety. Pt takes meds appropriately.

## 2020-07-24 DIAGNOSIS — F1520 Other stimulant dependence, uncomplicated: Secondary | ICD-10-CM | POA: Diagnosis not present

## 2020-07-24 MED ORDER — FLUPHENAZINE DECANOATE 25 MG/ML IJ SOLN
25.0000 mg | Freq: Once | INTRAMUSCULAR | Status: AC
  Administered 2020-07-24: 25 mg via INTRAMUSCULAR
  Filled 2020-07-24: qty 1
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
## 2020-07-24 NOTE — Progress Notes (Signed)
   07/24/20 2000  Psych Admission Type (Psych Patients Only)  Admission Status Involuntary  Psychosocial Assessment  Patient Complaints Anxiety  Eye Contact Fair  Facial Expression Anxious;Pensive;Worried  Affect Anxious;Preoccupied  Surveyor, minerals Activity Slow  Appearance/Hygiene Unremarkable  Behavior Characteristics Cooperative  Mood Anxious  Thought Process  Coherency WDL  Content WDL  Delusions None reported or observed  Perception WDL  Hallucination None reported or observed  Judgment Poor  Confusion None  Danger to Self  Current suicidal ideation? Denies  Danger to Others  Danger to Others None reported or observed

## 2020-07-24 NOTE — [facility] Group Notes (Signed)
Patient did not attend orientation group.  

## 2020-07-24 NOTE — Progress Notes (Signed)
Recreation Therapy Notes  Date: 10.7.21 Time: 1000 Location: 500 Hall Dayroom  Group Topic: Wellness  Goal Area(s) Addresses:  Patient will define components of whole wellness. Patient will verbalize benefit of whole wellness.  Intervention: Music  Activity: Exercise.  LRT led patients in a series of stretches.  Patients then took turns leading group in exercises of their choosing.  Patients could take breaks and get water as needed.  Education: Wellness, Building control surveyor.   Education Outcome: Acknowledges education/In group clarification offered/Needs additional education.   Clinical Observations/Feedback: Pt did not attend group session.     Caroll Rancher, LRT/CTRS         Lillia Abed, Haim Hansson A 07/24/2020 10:40 AM

## 2020-07-24 NOTE — Plan of Care (Signed)
Progress note  D: pt found in bed; allowed to rest. Upon awakening for lunch, pt was compliant with medication administration. Pt denies any physical pain. Pt does express malaise and lethargy. Pt expresses no gumption for self care today. Pt has continued to be isolative to their room. Pt is anxious and pensive on approach, forwarding little with morning assessment. Pt denies si/hi/ah/vh and verbally agrees to approach staff if these become apparent or before harming themself/others while at [facility].  A: Pt provided support and encouragement. Pt given medication per protocol and standing orders. Q59m safety checks implemented and continued.  R: Pt safe on the unit. Will continue to monitor.  Pt progressing in the following metrics  Problem: Coping: Goal: Ability to identify and develop effective coping behavior will improve Outcome: Progressing   Problem: Self-Concept: Goal: Ability to identify factors that promote anxiety will improve Outcome: Progressing   Problem: Education: Goal: Knowledge of disease or condition will improve Outcome: Progressing   Problem: Health Behavior/Discharge Planning: Goal: Identification of resources available to assist in meeting health care needs will improve Outcome: Progressing

## 2020-07-24 NOTE — [facility] Counselor (Signed)
CSW faxed referrals to the following facilities for possible placement in their residential programs:  [facility]   CSW will continue to seek placement for this patient and will provide additional information as needed.    Ruthann Cancer MSW, LCSW Clincal Social Worker  [facility]

## 2020-07-25 DIAGNOSIS — F1520 Other stimulant dependence, uncomplicated: Secondary | ICD-10-CM | POA: Diagnosis not present

## 2020-07-25 MED ORDER — OXCARBAZEPINE 300 MG/5ML PO SUSP
150.0000 mg | Freq: Every evening | ORAL | Status: DC
  Administered 2020-07-25 – 2020-07-26 (×2): 150 mg via ORAL
  Filled 2020-07-25 (×4): qty 2.5

## 2020-07-25 NOTE — [facility] Group Notes (Signed)
Patient did not attend orientation or psycho-educational group.

## 2020-07-25 NOTE — Progress Notes (Signed)
Pt stated she was doing a lot better, the Hallucinations are not as bad as when she came in , pt visible in the dayroom some with peers    07/25/20 2100  Psych Admission Type (Psych Patients Only)  Admission Status Involuntary  Psychosocial Assessment  Patient Complaints Anxiety  Eye Contact Fair  Facial Expression Anxious;Pensive;Worried  Affect Anxious;Preoccupied  Surveyor, minerals Activity Slow  Appearance/Hygiene Unremarkable  Behavior Characteristics Cooperative  Mood Labile;Suspicious;Preoccupied  Thought Lobbyist Blaming others  Delusions None reported or observed  Perception Hallucinations  Hallucination Auditory  Judgment Poor  Confusion None  Danger to Self  Current suicidal ideation? Denies  Self-Injurious Behavior No self-injurious ideation or behavior indicators observed or expressed   Agreement Not to Harm Self Yes  Description of Agreement Pt verbally contracts for safety  Danger to Others  Danger to Others None reported or observed

## 2020-07-25 NOTE — B[facility] Counselor (Signed)
CSW contacted [facility] [phone], to inquire about the pt's referral that was sent for residential treatment. CSW was informed that pt has been placed on the waitlist and there is no projected date on an open bed.   Fredirick Lathe, LCSWA Clinicial Social Worker [employer]

## 2020-07-25 NOTE — Progress Notes (Signed)
[place] NOVEL CORONAVIRUS (COVID-19) DAILY CHECK-OFF SYMPTOMS - answer yes or no to each - every day NO YES  Have you had a fever in the past 24 hours?  . Fever (Temp > 37.80C / 100F) X   Have you had any of these symptoms in the past 24 hours? . New Cough .  Sore Throat  .  Shortness of Breath .  Difficulty Breathing .  Unexplained Body Aches   X   Have you had any one of these symptoms in the past 24 hours not related to allergies?   . Runny Nose .  Nasal Congestion .  Sneezing   X   If you have had runny nose, nasal congestion, sneezing in the past 24 hours, has it worsened?  X   EXPOSURES - check yes or no X   Have you traveled outside the state in the past 14 days?  X   Have you been in contact with someone with a confirmed diagnosis of COVID-19 or PUI in the past 14 days without wearing appropriate PPE?  X   Have you been living in the same home as a person with confirmed diagnosis of COVID-19 or a PUI (household contact)?    X   Have you been diagnosed with COVID-19?    X              What to do next: Answered NO to all: Answered YES to anything:   Proceed with unit schedule Follow the [facility] Inpatient Flowsheet.   Pt visible in bed for majority of this morning "I just want to sleep". Refused to attend scheduled groups despite multiple prompts. Pt observed to be guarded, irritable, suspicious with blunted affect and mood lability / agitation. Explosive verbal outburst X2 this shift. Speech is loud, pressured and abusive at intervals. Pt became angry at female staff while he was doing his safety checks and envirnmental rounds "get the fuck out of my room, go to your damn job, get the fuck out now" yelling at staff. Verbal redirection was ineffective at the the time. Denies SI, HI, AVH and pain at this time.  PRN Ativan 2 mg PO given at 1512 as ordered with verbal education and effects monitored. Q 15 minutes safety checks maintained. Continued emotional support and encouragement  offered to pt as needed.  Pt appeared less angry when reassessed at 1605. Tolerates all PO intake well. Remains safe on and off unit.
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
## 2020-07-25 NOTE — Tx Team (Signed)
Interdisciplinary Treatment and Diagnostic Plan Update  07/25/2020 Time of Session: 10:15am Carol Miles MRN: [id number]  Principal Diagnosis: Amphetamine use disorder, severe (HCC)  Secondary Diagnoses: Principal Problem:   Amphetamine use disorder, severe (HCC) Active Problems:   Affective psychosis, bipolar (HCC)   Acute psychosis (HCC)   Current Medications:  Current Facility-Administered Medications  Medication Dose Route Frequency Provider Last Rate Last Admin  . acetaminophen (TYLENOL) tablet 650 mg  650 mg Oral Q6H PRN Aldean Baker, NP      . alum & mag hydroxide-simeth (MAALOX/MYLANTA) 200-200-20 MG/5ML suspension 30 mL  30 mL Oral Q4H PRN Aldean Baker, NP      . benztropine (COGENTIN) tablet 2 mg  2 mg Oral BID PRN Mariel Craft, MD       Or  . benztropine mesylate (COGENTIN) injection 2 mg  2 mg Intramuscular BID PRN Mariel Craft, MD      . feeding supplement (ENSURE ENLIVE) (ENSURE ENLIVE) liquid 237 mL  237 mL Oral BID BM Antonieta Pert, MD   237 mL at 07/24/20 1536  . fluPHENAZine (PROLIXIN) tablet 10 mg  10 mg Oral QHS Mariel Craft, MD   10 mg at 07/24/20 2047  . hydrOXYzine (ATARAX/VISTARIL) tablet 25 mg  25 mg Oral TID PRN Aldean Baker, NP   25 mg at 07/24/20 2047  . LORazepam (ATIVAN) tablet 2 mg  2 mg Oral Q6H PRN Mariel Craft, MD   2 mg at 07/24/20 1633   Or  . LORazepam (ATIVAN) injection 2 mg  2 mg Intramuscular Q6H PRN Mariel Craft, MD      . magnesium hydroxide (MILK OF MAGNESIA) suspension 30 mL  30 mL Oral Daily PRN Aldean Baker, NP      . nicotine polacrilex (NICORETTE) gum 2 mg  2 mg Oral PRN Antonieta Pert, MD   2 mg at 07/24/20 1536  . OLANZapine zydis (ZYPREXA) disintegrating tablet 20 mg  20 mg Oral Q8H PRN Mariel Craft, MD   20 mg at 07/22/20 1346   And  . ziprasidone (GEODON) injection 20 mg  20 mg Intramuscular PRN Mariel Craft, MD      . OLANZapine zydis [facility]) disintegrating tablet 5 mg  5 mg Oral  TID PRN Aldean Baker, NP   5 mg at 07/20/20 1844  . traZODone (DESYREL) tablet 50 mg  50 mg Oral QHS PRN Aldean Baker, NP       PTA Medications: Medications Prior to Admission  Medication Sig Dispense Refill Last Dose  . carbamazepine (TEGRETOL) 100 MG chewable tablet Chew 2 tablets (200 mg total) by mouth 2 (two) times daily. For mood stabilization (Patient not taking: Reported on 07/19/2020) 60 tablet 0   . fluPHENAZine (PROLIXIN) 5 MG tablet Take 1 tablet (5 mg) by mouth in the morning & 2 tablets (10 mg) at bedtime: For mood control (Patient not taking: Reported on 07/19/2020) 90 tablet 0   . hydrOXYzine (ATARAX/VISTARIL) 25 MG tablet Take 1 tablet (25 mg total) by mouth 3 (three) times daily as needed for anxiety. (Patient not taking: Reported on 07/19/2020) 75 tablet 0   . nicotine polacrilex (NICORETTE) 2 MG gum Take 1 each (2 mg total) by mouth as needed. (May buy from over the counter): For smoking cessation (Patient not taking: Reported on 07/19/2020) 100 tablet 0   . traZODone (DESYREL) 50 MG tablet Take 1 tablet (50 mg total) by mouth at  bedtime as needed for sleep. (Patient not taking: Reported on 07/19/2020) 30 tablet 0     Patient Stressors: Financial difficulties Health problems Medication change or noncompliance Substance abuse Traumatic event  Patient Strengths: Barrister's clerk for treatment/growth Physical Health Supportive family/friends  Treatment Modalities: Medication Management, Group therapy, Case management,  1 to 1 session with clinician, Psychoeducation, Recreational therapy.   Physician Treatment Plan for Primary Diagnosis: Amphetamine use disorder, severe (HCC) Long Term Goal(s): Improvement in symptoms so as ready for discharge Improvement in symptoms so as ready for discharge   Short Term Goals: Ability to identify changes in lifestyle to reduce recurrence of condition will improve Ability to verbalize feelings will improve Ability to  disclose and discuss suicidal ideas Ability to demonstrate self-control will improve Ability to identify and develop effective coping behaviors will improve Ability to maintain clinical measurements within normal limits will improve Compliance with prescribed medications will improve Ability to identify triggers associated with substance abuse/mental health issues will improve Ability to identify changes in lifestyle to reduce recurrence of condition will improve Ability to verbalize feelings will improve Ability to disclose and discuss suicidal ideas Ability to demonstrate self-control will improve Ability to identify and develop effective coping behaviors will improve Ability to maintain clinical measurements within normal limits will improve Compliance with prescribed medications will improve Ability to identify triggers associated with substance abuse/mental health issues will improve  Medication Management: Evaluate patient's response, side effects, and tolerance of medication regimen.  Therapeutic Interventions: 1 to 1 sessions, Unit Group sessions and Medication administration.  Evaluation of Outcomes: Progressing  Physician Treatment Plan for Secondary Diagnosis: Principal Problem:   Amphetamine use disorder, severe (HCC) Active Problems:   Affective psychosis, bipolar (HCC)   Acute psychosis (HCC)  Long Term Goal(s): Improvement in symptoms so as ready for discharge Improvement in symptoms so as ready for discharge   Short Term Goals: Ability to identify changes in lifestyle to reduce recurrence of condition will improve Ability to verbalize feelings will improve Ability to disclose and discuss suicidal ideas Ability to demonstrate self-control will improve Ability to identify and develop effective coping behaviors will improve Ability to maintain clinical measurements within normal limits will improve Compliance with prescribed medications will improve Ability to  identify triggers associated with substance abuse/mental health issues will improve Ability to identify changes in lifestyle to reduce recurrence of condition will improve Ability to verbalize feelings will improve Ability to disclose and discuss suicidal ideas Ability to demonstrate self-control will improve Ability to identify and develop effective coping behaviors will improve Ability to maintain clinical measurements within normal limits will improve Compliance with prescribed medications will improve Ability to identify triggers associated with substance abuse/mental health issues will improve     Medication Management: Evaluate patient's response, side effects, and tolerance of medication regimen.  Therapeutic Interventions: 1 to 1 sessions, Unit Group sessions and Medication administration.  Evaluation of Outcomes: Progressing   RN Treatment Plan for Primary Diagnosis: Amphetamine use disorder, severe (HCC) Long Term Goal(s): Knowledge of disease and therapeutic regimen to maintain health will improve  Short Term Goals: Ability to remain free from injury will improve, Ability to demonstrate self-control, Ability to participate in decision making will improve, Ability to verbalize feelings will improve, Ability to identify and develop effective coping behaviors will improve and Compliance with prescribed medications will improve  Medication Management: RN will administer medications as ordered by provider, will assess and evaluate patient's response and provide education to patient for prescribed medication.  RN will report any adverse and/or side effects to prescribing provider.  Therapeutic Interventions: 1 on 1 counseling sessions, Psychoeducation, Medication administration, Evaluate responses to treatment, Monitor vital signs and CBGs as ordered, Perform/monitor CIWA, COWS, AIMS and Fall Risk screenings as ordered, Perform wound care treatments as ordered.  Evaluation of Outcomes:  Progressing   LCSW Treatment Plan for Primary Diagnosis: Amphetamine use disorder, severe (HCC) Long Term Goal(s): Safe transition to appropriate next level of care at discharge, Engage patient in therapeutic group addressing interpersonal concerns.  Short Term Goals: Engage patient in aftercare planning with referrals and resources, Increase ability to appropriately verbalize feelings, Increase emotional regulation, Facilitate patient progression through stages of change regarding substance use diagnoses and concerns, Identify triggers associated with mental health/substance abuse issues and Increase skills for wellness and recovery  Therapeutic Interventions: Assess for all discharge needs, 1 to 1 time with Social worker, Explore available resources and support systems, Assess for adequacy in community support network, Educate family and significant other(s) on suicide prevention, Complete Psychosocial Assessment, Interpersonal group therapy.  Evaluation of Outcomes: Progressing   Progress in Treatment: Attending groups: No. Participating in groups: No. Taking medication as prescribed: Yes. Toleration medication: Yes. Family/Significant other contact made: No, will contact:  Pt declined consents Patient understands diagnosis: No. Discussing patient identified problems/goals with staff: Yes. Medical problems stabilized or resolved: Yes. Denies suicidal/homicidal ideation: Yes. Issues/concerns per patient self-inventory: No. Other: None  New problem(s) identified: No, Describe:  None  New Short Term/Long Term Goal(s): medication stabilization, elimination of SI thoughts, development of comprehensive mental wellness plan.   Patient Goals: "I'm here to talk to a doctor, not talk to a social worker"  Discharge Plan or Barriers: Patient recently admitted. CSW will continue to follow and assess for appropriate referrals and possible discharge planning.   Reason for Continuation of  Hospitalization: Aggression Delusions  Medication stabilization  Estimated Length of Stay: 3-5 Days  Attendees: Patient: Carol Miles 07/25/2020   Physician: Landry Mellow, MD 07/25/2020   Nursing:  07/25/2020   RN Care Manager: 07/25/2020   Social Worker: Melba Coon, LCSW 07/25/2020   Recreational Therapist:  07/25/2020   Other:  07/25/2020   Other:  07/25/2020   Other: 07/25/2020      Scribe for Treatment Team: Aram Beecham, LCSWA 07/25/2020 11:34 AM

## 2020-07-25 NOTE — B[facility] Counselor (Signed)
CSW received call from Maralyn Sago at [facility]. Informed that pt would not qualify for caring and they are recommending a higher level of care.

## 2020-07-26 DIAGNOSIS — F1520 Other stimulant dependence, uncomplicated: Secondary | ICD-10-CM | POA: Diagnosis not present

## 2020-07-26 NOTE — BHH Group Notes (Signed)
.  Psychoeducational Group Note    Date: 07-26-20 Time: 0900-1000    Goal Setting   Purpose of Group: This group helps to provide patients with the steps of setting a goal that is specific, measurable, attainable, realistic and time specific. A discussion on how we keep ourselves stuck with negative self talk.    Participation Level:  Did not attend   Mayte Diers A  
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
## 2020-07-26 NOTE — Progress Notes (Signed)
Pt slept majority of this morning and afternoon. Irritable when she woke up for evening medications. Became agitated when encouraged by writer to create a proper sleep routine to enable her attend unit groups "I just want to sleep or send me out. I have not slept because all these men can't keep their tongues in their mouth. If you had a dog licking all over you". Speech is tangential, loud and pressured at times and her mood remains labile. Pt came up to nursing station, tearful stating "I can't move my tongue now after I took that medicine you just gave me" (Trileptal solution). Emotional support and encouragement offered to pt throughout this shift. Q 15 minutes safety checks maintained. Scheduled and PRN (Cogentin 2 mg PO-EPS) medication given as ordered and effects monitored. Pt declined breakfast and lunch when offered. Went off unit unit for dinner and tolerated meal well. Remains medication compliant. Safety maintained.

## 2020-07-26 NOTE — Progress Notes (Signed)
Adult Psychoeducational Group Note  Date:  07/26/2020 Time:  12:38 AM  Group Topic/Focus:  Wrap-Up Group:   The focus of this group is to help patients review their daily goal of treatment and discuss progress on daily workbooks.  Participation Level:  Active  Participation Quality:  Appropriate  Affect:  Appropriate  Cognitive:  Appropriate  Insight: Appropriate  Engagement in Group:  Developing/Improving  Modes of Intervention:  Discussion  Additional Comments:  Pt stated her goal for today was to focus on her treatment plan. Pt stated she felt she accomplished her goal today. Pt stated she talk with her doctor and social worker, regarding her care today. Pt stated been able to contact her mother today, improved her overall day. Pt stated she took all her medication today from her providers. Pt rated her overall day a 3 out of 10. Pt stated her day was a 3 because all she did was sleep all day. Pt stated her goal tomorrow is to interact more with staff and peers. Pt stated her appetite was pretty good today and she attended all meals. Pt stated her sleep last night was good. Pt stated the goal for tonight was to get some rest. Pt stated she was in some physical pain. Pt stated her pinky toe on her left foot was in pain. Pt rated her pain level a 3. Pt nurse was updated on situation.  Pt deny auditory or visual hallucinations. Pt denies thoughts of harming herself or others. Pt stated she would alert staff if anything changes.  Felipa Furnace 07/26/2020, 12:38 AM

## 2020-07-26 NOTE — Progress Notes (Signed)
   07/26/20 2040  COVID-19 Daily Checkoff  Have you had a fever (temp > 37.80C/100F)  in the past 24 hours?  No  If you have had runny nose, nasal congestion, sneezing in the past 24 hours, has it worsened? No  COVID-19 EXPOSURE  Have you traveled outside the state in the past 14 days? No  Have you been in contact with someone with a confirmed diagnosis of COVID-19 or PUI in the past 14 days without wearing appropriate PPE? No  Have you been living in the same home as a person with confirmed diagnosis of COVID-19 or a PUI (household contact)? No  Have you been diagnosed with COVID-19? No   

## 2020-07-27 DIAGNOSIS — F1520 Other stimulant dependence, uncomplicated: Secondary | ICD-10-CM | POA: Diagnosis not present

## 2020-07-27 MED ORDER — OXCARBAZEPINE 300 MG/5ML PO SUSP
300.0000 mg | Freq: Every evening | ORAL | Status: DC
  Administered 2020-07-27 – 2020-07-28 (×2): 300 mg via ORAL
  Filled 2020-07-27 (×4): qty 5

## 2020-07-27 NOTE — Progress Notes (Signed)
Patient observed sitting in the dayroom briefly watching tv tonight. She was calm but became tearful at medication window when writer asked what her plans were after discharge. She reported that her so called friend was not her friend. She reported that she was put out at night time with no where to go. Writer encouraged her to change her circle of friends and consider getting some help with her addiction. She was receptive to advice. She was compliant with her medications. Safety maintained on unit with 15 min checks.

## 2020-07-27 NOTE — Progress Notes (Signed)
Adult Psychoeducational Group Note  Date:  07/27/2020 Time:  4:17 AM  Group Topic/Focus:  Wrap-Up Group:   The focus of this group is to help patients review their daily goal of treatment and discuss progress on daily workbooks.  Participation Level:  Minimal  Participation Quality:  Drowsy, Intrusive, Inattentive, Monopolizing and Resistant  Affect:  Flat, Irritable, Labile, Lethargic, Not Congruent and Resistant  Cognitive:  Disorganized, Confused and Delusional  Insight: Lacking and Limited  Engagement in Group:  Lacking, Limited, Monopolizing, Off Topic, Poor and Resistant  Modes of Intervention:  Discussion  Additional Comments:  Pt stated her goal for today was to focus on her treatment plan. Pt stated she felt she accomplished her goal today. Pt stated she talk with her doctor and social worker, regarding her care today. Pt stated been able to contact her mother today, improved her overall day. Pt stated she took all her medication today from her providers. Pt rated her overall day a 2 out of 10. Writer asked pt what made her day a 2. Pt stated that she has been very depressed today. Pt stated if she had a gun, she would shot herself. Writer asked pt could she contract for safety. Pt stated she could. Pt nurse was updated on situation.  Pt stated her appetite was poor today. Pt stated her sleep last night was poor. Pt stated the goal for tonight was to get some rest. Pt nurse was updated on issue. Pt stated she was in no physical pain. Pt deny auditory or visual hallucinations. Pt denies thoughts of harming others. Pt stated she would alert staff if anything changes  Felipa Furnace 07/27/2020, 4:17 AM
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
## 2020-07-27 NOTE — Progress Notes (Signed)
   07/27/20 0700  Vital Signs  Temp 98 F (36.7 C)  Temp Source Oral  Pulse Rate 89  Pulse Rate Source Dinamap  Resp 16  BP 117/84  BP Location Right Arm  BP Method Automatic  Patient Position (if appropriate) Lying  Oxygen Therapy  SpO2 99 %   D: Patient denies SI/HI/AVH. Patient rated depression 4/10 and anxiety 5/10. Pt. Isolated in her room and did not attend morning group. A: Pt. Took her morning ensure and was irritable in the morning.  Support and encouragement provided Routine safety checks conducted every 15 minutes. Patient  Informed to notify staff with any concerns.   R: Safety maintained.

## 2020-07-27 NOTE — [facility] Group Notes (Signed)
Adult Psychoeducational Group Not °Date:  07/27/2020 °Time:  0900-1045 °Group Topic/Focus: PROGRESSIVE RELAXATION. Miles group where deep breathing is taught and tensing and relaxation muscle groups is used. Imagery is used as well.  Pts are asked to imagine 3 pillars that hold them up when they are not able to hold themselves up. ° °Participation Level:  Did not attend ° ° °Carol Miles °07/27/2020 ° ° ° °

## 2020-07-27 NOTE — [facility] Group Notes (Signed)
Adult Psychoeducational Group Note  Date:  07/27/2020 Time:  9:04 PM  Group Topic/Focus:  Wrap-Up Group:   The focus of this group is to help patients review their daily goal of treatment and discuss progress on daily workbooks.  Participation Level:  Active  Participation Quality:  Appropriate  Affect:  Appropriate  Cognitive:  Appropriate  Insight: Appropriate  Engagement in Group:  Engaged  Modes of Intervention:  Discussion  Additional Comments:  Pt stated she did not have any goals today due to sleeping most of the day and feeling depressed.  Pt did speak with boyfriend and that did make her day a little better.  Pt rated the day at a 5/10.  Grant Swager 07/27/2020, 9:04 PM

## 2020-07-27 NOTE — [facility] Group Notes (Signed)
Psychoeducational Group Note  Date: 07-27-20 Time:  1300  Group Topic/Focus:  Making Healthy Choices:   The focus of this group is to help patients identify negative/unhealthy choices they were using prior to admission and identify positive/healthier coping strategies to replace them upon discharge.  Participation Level:  Active  Participation Quality:  Appropriate  Affect:  Appropriate  Cognitive:  Oriented  Insight:  Improving  Engagement in Group:  Engaged  Additional Comments:  Pt rates her energy at a 2 or 3. Agitated in the beginning of the group. Claming down after a little bit.  Dione Housekeeper

## 2020-07-28 NOTE — Plan of Care (Signed)
Progress note  D: pt found in bed; allowed to rest. Upon awakening, pt compliant with medication administration. Pt expresses "weird dreams" where they saw "dead little ducks marching in a row". Pt denies any other physical complaints or pain, but does seem anxious. Pt continues to be isolative to their room. Pt is pleasant. Pt denies si/hi/ah/vh and verbally agrees to approach staff if these become apparent or before harming themself/others while at [facility].  A: Pt provided support and encouragement. Pt given medication per protocol and standing orders. Q73m safety checks implemented and continued.  R: Pt safe on the unit. Will continue to monitor.  Pt progressing in the following metrics  Problem: Health Behavior/Discharge Planning: Goal: Ability to identify changes in lifestyle to reduce recurrence of condition will improve Outcome: Progressing   Problem: Physical Regulation: Goal: Complications related to the disease process, condition or treatment will be avoided or minimized Outcome: Progressing   Problem: Safety: Goal: Ability to remain free from injury will improve Outcome: Progressing

## 2020-07-28 NOTE — Progress Notes (Signed)
ADULT GRIEF GROUP NOTE:  Spiritual care group on grief and loss facilitated by chaplain Burnis Kingfisher  Group Goal:  Support / Education around grief and loss  Members engage in facilitated group support and psycho-social education.  Group Description:  Following introductions and group rules, group members engaged in facilitated group dialog and support around topic of loss, with particular support around experiences of loss in their lives. Group Identified types of loss (relationships / self / things) and identified patterns, circumstances, and changes that precipitate losses. Reflected on thoughts / feelings around loss, normalized grief responses, and recognized variety in grief experience.  Patient Progress: Did not attend
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
## 2020-07-28 NOTE — Progress Notes (Signed)
Recreation Therapy Notes  Date:  10.11.21 Time: 0930 Location: 300 Hall Dayroom  Group Topic: Stress Management  Goal Area(s) Addresses:  Patient will identify positive stress management techniques. Patient will identify benefits of using stress management post d/c.  Intervention: Stress Management  Activity : Meditation.  LRT played a meditation that focused on making the most of each moment of the day.  Patients are to listen and follow along as meditation is played.   Education:  Stress Management, Discharge Planning.   Education Outcome: Acknowledges Education  Clinical Observations/Feedback: Pt did not attend group.    Adianna Darwin, LRT/CTRS         Ayumi Wangerin A 07/28/2020 12:32 PM 

## 2020-07-28 NOTE — BHH Counselor (Signed)
FOLLOW-UP: CSW spoke with June in admission with [facility] residential treatment services. She requested follow-up information and was provided an update by CSW. She stated that she will forward Pt's information to the nurse for review for admission. She was unable to provide an approximate bed date.   Jacinta Shoe, LCSW

## 2020-07-28 NOTE — Progress Notes (Signed)
The patient rated her day as a 6 out of 10. She shared in group that she is grateful for being in the hospital and would like to go to "rehab" or outpatient treatment following discharge. She indicated that she has a place to live if she is not sent to long-term treatment. Her goal for tomorrow is to have Medicaid sign a release of information for her.

## 2020-07-28 NOTE — Progress Notes (Addendum)
Pt is alert and oriented x 4. Pt denies SI, HI, AVH and pain. Pt interacted appropriately with others in the mileui. Pt is goal oriented ans stated she reached her goal today. Pt complied with scheduled medications. No issues to report during this shift. Will continue to monitor and assess. Safety maintained.

## 2020-07-29 MED ORDER — OXCARBAZEPINE 300 MG PO TABS: 300.0000 mg | ORAL_TABLET | Freq: Every day | ORAL | 0 refills | Status: DC

## 2020-07-29 MED ORDER — FLUPHENAZINE HCL 10 MG PO TABS
10.0000 mg | ORAL_TABLET | Freq: Every day | ORAL | 0 refills | Status: DC
Start: 2020-07-29 — End: 2021-04-10

## 2020-07-29 MED ORDER — NICOTINE POLACRILEX 2 MG MT GUM
2.0000 mg | CHEWING_GUM | OROMUCOSAL | 0 refills | Status: DC | PRN
Start: 2020-07-29 — End: 2021-04-10

## 2020-07-29 MED ORDER — HYDROXYZINE HCL 25 MG PO TABS
25.0000 mg | ORAL_TABLET | Freq: Three times a day (TID) | ORAL | 0 refills | Status: DC | PRN
Start: 2020-07-29 — End: 2021-04-10

## 2020-07-29 MED ORDER — OXCARBAZEPINE 300 MG PO TABS
300.0000 mg | ORAL_TABLET | Freq: Every day | ORAL | Status: DC
  Administered 2020-07-29: 300 mg via ORAL
  Filled 2020-07-29: qty 1
  Filled 2020-07-29: qty 14

## 2020-07-29 NOTE — Progress Notes (Signed)
Pt visible in bed awake this afternoon on initial interaction. Presented with blunted affect but was cooperative with assessment. Denies SI, HI, AVH and pain at the time. Confirmed trouble sleeping initially last night "but I finally went to sleep reading that magazine" point to magazine on the floor. Received PRN Ativan 2 mg PO for anxiety as she was crying this afternoon related to d/c to [facility] as scheduled tomorrow. Per pt "I have never been to rehab. I don't know what type of people will be there. I'm scared about everything". Pt later observed on phone this afternoon. Support and encouragement offered to pt throughout this shift. Q 15 minutes safety checks maintained safety without incident. PRN 2 mg PO given for c/o anxiety 10/10 at 141 administered with verbal education and effects monitored. Oncoming shift notified of pt's change in plan.  Pt is receptive to care and cooperative with unit routines. Tolerates all PO intake well. Reports decrease anxiety level at 2/10 when reassessed. Pt later told another nurse that she will like to rather go home with boyfriend rather than to [facility] as per d/c plan.

## 2020-07-29 NOTE — BHH Suicide Risk Assessment (Signed)
[facility] Discharge Suicide Risk Assessment   Principal Problem: Amphetamine use disorder, severe (HCC) Discharge Diagnoses: Principal Problem:   Amphetamine use disorder, severe (HCC) Active Problems:   Affective psychosis, bipolar (HCC)   Acute psychosis (HCC)   Total Time spent with patient: 15 minutes  Musculoskeletal: Strength & Muscle Tone: within normal limits Gait & Station: normal Patient leans: N/A  Psychiatric Specialty Exam: Review of Systems  Constitutional: Negative for chills and fever.  Eyes: Negative for redness.  Neurological: Negative for facial asymmetry.  Psychiatric/Behavioral: Negative for confusion.    Blood pressure 99/76, pulse 93, temperature 97.9 F (36.6 C), temperature source Oral, resp. rate 18, height 5\' 7"  (1.702 m), weight 60 kg, SpO2 100 %.Body mass index is 20.72 kg/m.  General Appearance: Casual and Fairly Groomed, laying in bed in NAD  Eye Contact::  Fair  Speech:  Clear and Coherent and Normal Rate  Volume:  Normal  Mood:  "ok"  Affect:  Constricted and euthymic  Thought Process:  Coherent, Goal Directed and Linear  Orientation:  Full (Time, Place, and Person)  Thought Content:  WDL and Logical  Suicidal Thoughts:  No  Homicidal Thoughts:  No  Memory:  Immediate;   Fair Recent;   Fair  Judgement:  Fair  Insight:  Fair  Psychomotor Activity:  Normal  Concentration:  Fair  Recall:  [id number] of Knowledge:Fair  Language: Good  Akathisia:  No  Handed:  Right  AIMS (if indicated):     Assets:  Desire for Improvement Resilience  Sleep:  Number of Hours: 6.5  Cognition: WNL  ADL's:  Intact   Mental Status Per Nursing Assessment::   On Admission:  Suicidal ideation indicated by patient, Suicide plan, Intention to act on suicide plan, Plan includes specific time, place, or method  Demographic Factors:  Divorced or widowed, Caucasian, Low socioeconomic status, Living alone and Unemployed  Loss Factors: Financial problems/change  in socioeconomic status  Historical Factors: Impulsivity  Risk Reduction Factors:   NA  Continued Clinical Symptoms:  Bipolar Disorder:   Mixed State Alcohol/Substance Abuse/Dependencies Unstable or Poor Therapeutic Relationship Previous Psychiatric Diagnoses and Treatments  Cognitive Features That Contribute To Risk:  None    Suicide Risk:  Minimal: No identifiable suicidal ideation.  Patients presenting with no risk factors but with morbid ruminations; may be classified as minimal risk based on the severity of the depressive symptoms   Follow-up Information    Services, [facility] Follow up.   Why: A referral for ACTT services in [facility] has been made for you. Please call at discharge from residential treatment to follow up with services. Contact information: [address] [place] [phone]               Plan Of Care/Follow-up recommendations:  Activity:  as tolerated Diet:  regular Other:      Patient is instructed prior to discharge to: Take all medications as prescribed by his/her mental healthcare provider. Report any adverse effects and or reactions from the medicines to his/her outpatient provider promptly. Patient has been instructed & cautioned: To not engage in alcohol and or illegal drug use while on prescription medicines. In the event of worsening symptoms, patient is instructed to call the crisis hotline, 911 and or go to the nearest ED for appropriate evaluation and treatment of symptoms. To follow-up with his/her primary care provider for your other medical issues, concerns and or health care needs.   On my interview, patient is  in NAD, alert, oriented, calm, cooperative, and attentive, with normal affect, speech, and behavior. Objectively, there is no evidence of psychosis/ mania (able to converse coherently, linear and goal directed thought, no RIS, no distractibility, not pre-occupied, no FOI, etc) nor depression  to the point of suicidality (able to concentrate, affect full and reactive, speech normal r/v/t, no psychomotor retardation/agitation, etc).  Overall, patient appears to be at the point, in the absence of inhibiting or disinhibiting symptoms, where she can successfully move to lesser restrictive setting for care. Patient is stable for discharge      Estella Husk, MD 07/29/2020, 4:02 PM

## 2020-07-29 NOTE — Progress Notes (Signed)
The patient verbalized in group this evening that she does not want to go to rehab tomorrow and would like to return to her boyfriend. She feels that she only needs outpatient treatment rather an inpatient treatment. She was tearful at times. The patient received feedback from her peers in the group who encouraged her to go to rehab versus going home to her boyfriend. She minimizes her drug use and states that everyone is entitled to their own opinion.

## 2020-07-29 NOTE — Progress Notes (Signed)
   07/29/20 0007  Psych Admission Type (Psych Patients Only)  Admission Status Involuntary  Psychosocial Assessment  Patient Complaints Insomnia  Eye Contact Fair  Facial Expression Animated;Anxious  Affect Anxious  Speech Logical/coherent  Interaction Assertive;Isolative  Motor Activity Slow  Appearance/Hygiene Unremarkable  Behavior Characteristics Cooperative;Anxious  Mood Anxious  Thought Process  Coherency WDL  Content WDL  Delusions None reported or observed  Perception WDL  Hallucination None reported or observed  Judgment Poor  Confusion None  Danger to Self  Current suicidal ideation? Denies  Self-Injurious Behavior No self-injurious ideation or behavior indicators observed or expressed   Danger to Others  Danger to Others None reported or observed  D: Patient in room most of the evening. Pt stated she is tolerating medications well.  A: Medications administered as prescribed. Support and encouragement provided as needed.  R: Patient remains safe on the unit. Will continue to monitor for safety and stability.
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
## 2020-07-30 NOTE — Progress Notes (Signed)
RN met with pt and reviewed pt's discharge instructions.  Pt verbalized understanding of discharge instructions and pt did not have any questions. RN reviewed and provided pt with a copy of SRA, AVS and Transition Record.  RN returned pt's belongings to pt.  Prescriptions and samples were given to pt.  Pt denied SI/HI/AVH and voiced no concerns.  Pt was appreciative of the care pt received at [facility].  RN escorted pt to pt transport driver who was taking pt to [facility]

## 2020-07-30 NOTE — Progress Notes (Signed)
Recreation Therapy Notes  Date:  10.13.21 Time: 0930 Location: 300 Group Room  Group Topic: Stress Management  Goal Area(s) Addresses:  Patient will identify positive stress management techniques. Patient will identify benefits of using stress management post d/c.  Intervention: Stress Management  Activity: Guided Imagery.  LRT read a script that led patients on a journey to envision their peaceful place.  Patients were imagine all the things that made their place peaceful to them.  Education:  Stress Management, Discharge Planning.   Education Outcome: Acknowledges Education  Clinical Observations/Feedback:  Pt did not attend activity.    Caroll Rancher, LRT/CTRS         Lillia Abed, Blondina Coderre A 07/30/2020 10:17 AM

## 2020-07-30 NOTE — Progress Notes (Signed)
This Clinical research associate discussed AVS discharge packet with pt which includes follow-up appointments and medications to be continued upon discharge. Pt also educated about not drinking ETOH or using illegal drugs while taking her prescription medications. Pt has been provided her suicide risk assessment completed by her MD and transition record with her lab results. Provided opportunity to ask any questions. Pt has been provided resources/contact numbers to reach out to for suicidal thoughts and mental health related needs. Oncoming nurse to complete discharge process.

## 2020-07-30 NOTE — Progress Notes (Signed)
Pt was tearful earlier tonight because she feels overwhelmed and anxious about going to [facility] upon discharge. Pt said this will be her first time attending a rehab center. She doesn't enjoy the idea of some of the restrictions she will have like not being able to go outside to smoke. Pt said that she is planning to discharge home to her boyfriend now. But then later on pt said that her plan didn't fall through because his father can't pick her up. Now she is opting to go to [facility] upon discharge. Pt denies SI/HI and AVH. Active listening, reassurance, and support provided. Medications administered as ordered by MD. Q 15 min safety checks continue. Pt's safety has been maintained.   07/29/20 2126  Psych Admission Type (Psych Patients Only)  Admission Status Involuntary  Psychosocial Assessment  Patient Complaints Anxiety;Depression;Sadness;Worrying;Crying spells  Eye Contact Fair  Facial Expression Anxious;Animated  Affect Anxious;Sad  Speech Logical/coherent  Interaction Assertive  Motor Activity Slow  Appearance/Hygiene In scrubs  Behavior Characteristics Cooperative;Calm;Anxious  Mood Depressed;Anxious;Sad  Thought Process  Coherency WDL  Content WDL  Delusions None reported or observed  Perception WDL  Hallucination None reported or observed  Judgment Poor  Confusion None  Danger to Self  Current suicidal ideation? Denies  Self-Injurious Behavior No self-injurious ideation or behavior indicators observed or expressed   Agreement Not to Harm Self Yes  Description of Agreement pt verbally contracts for safety  Danger to Others  Danger to Others None reported or observed

## 2020-07-30 NOTE — Progress Notes (Signed)
°  [facility] Adult Case Management Discharge Plan :  Will you be returning to the same living situation after discharge:  No. Will be transferring to residential substance use treatment. At discharge, do you have transportation home?: No. Safe Transport will be arranged. Do you have the ability to pay for your medications: No. Samples to be provided at discharge.  Release of information consent forms completed and in the chart;  Patient's signature needed at discharge.  Patient to Follow up at:  Follow-up Information    Services, [facility] Follow up.   Why: A referral for ACTT services in [facility] has been made for you. Please call at discharge from residential treatment to follow up with services. Contact information: [address] [place] [address] [phone]               Next level of care provider has access to [facility] Link:no  Safety Planning and Suicide Prevention discussed: Yes,  with patient     Has patient been referred to the Quitline?: Patient refused referral  Patient has been referred for addiction treatment: Yes  Otelia Santee, LCSW 07/30/2020, 9:14 AM

## 2020-07-30 NOTE — Progress Notes (Signed)
   07/29/20 2126  COVID-19 Daily Checkoff  Have you had a fever (temp > 37.80C/100F)  in the past 24 hours?  No  COVID-19 EXPOSURE  Have you traveled outside the state in the past 14 days? No  Have you been in contact with someone with a confirmed diagnosis of COVID-19 or PUI in the past 14 days without wearing appropriate PPE? No  Have you been living in the same home as a person with confirmed diagnosis of COVID-19 or a PUI (household contact)? No  Have you been diagnosed with COVID-19? No

## 2020-11-18 ENCOUNTER — Encounter (HOSPITAL_COMMUNITY): Payer: Self-pay

## 2020-11-18 ENCOUNTER — Emergency Department (HOSPITAL_COMMUNITY)
Admission: EM | Admit: 2020-11-18 | Discharge: 2020-11-21 | Disposition: A | Discharge status: Home / Self Care | Payer: Self-pay | Attending: Emergency Medicine | Admitting: Emergency Medicine

## 2020-11-18 DIAGNOSIS — F1220 Cannabis dependence, uncomplicated: Secondary | ICD-10-CM | POA: Diagnosis present

## 2020-11-18 DIAGNOSIS — F1910 Other psychoactive substance abuse, uncomplicated: Secondary | ICD-10-CM | POA: Insufficient documentation

## 2020-11-18 DIAGNOSIS — F15229 Other stimulant dependence with intoxication, unspecified: Secondary | ICD-10-CM | POA: Insufficient documentation

## 2020-11-18 DIAGNOSIS — Z21 Asymptomatic human immunodeficiency virus [HIV] infection status: Secondary | ICD-10-CM | POA: Insufficient documentation

## 2020-11-18 DIAGNOSIS — F3160 Bipolar disorder, current episode mixed, unspecified: Secondary | ICD-10-CM | POA: Insufficient documentation

## 2020-11-18 DIAGNOSIS — F32A Depression, unspecified: Secondary | ICD-10-CM

## 2020-11-18 DIAGNOSIS — F17210 Nicotine dependence, cigarettes, uncomplicated: Secondary | ICD-10-CM | POA: Insufficient documentation

## 2020-11-18 DIAGNOSIS — F12280 Cannabis dependence with cannabis-induced anxiety disorder: Secondary | ICD-10-CM | POA: Insufficient documentation

## 2020-11-18 DIAGNOSIS — F1520 Other stimulant dependence, uncomplicated: Secondary | ICD-10-CM | POA: Diagnosis present

## 2020-11-18 DIAGNOSIS — Z20822 Contact with and (suspected) exposure to covid-19: Secondary | ICD-10-CM | POA: Insufficient documentation

## 2020-11-18 DIAGNOSIS — F1320 Sedative, hypnotic or anxiolytic dependence, uncomplicated: Secondary | ICD-10-CM | POA: Insufficient documentation

## 2020-11-18 DIAGNOSIS — F29 Unspecified psychosis not due to a substance or known physiological condition: Secondary | ICD-10-CM | POA: Insufficient documentation

## 2020-11-18 DIAGNOSIS — F1994 Other psychoactive substance use, unspecified with psychoactive substance-induced mood disorder: Secondary | ICD-10-CM

## 2020-11-18 LAB — CBC WITH DIFFERENTIAL/PLATELET
Abs Immature Granulocytes: 0.01 10*3/uL (ref 0.00–0.07)
Basophils Absolute: 0.1 10*3/uL (ref 0.0–0.1)
Basophils Relative: 1 %
Eosinophils Absolute: 0.3 10*3/uL (ref 0.0–0.5)
Eosinophils Relative: 5 %
HCT: 36.9 % (ref 36.0–46.0)
Hemoglobin: 12.7 g/dL (ref 12.0–15.0)
Immature Granulocytes: 0 %
Lymphocytes Relative: 45 %
Lymphs Abs: 2.8 10*3/uL (ref 0.7–4.0)
MCH: 31.7 pg (ref 26.0–34.0)
MCHC: 34.4 g/dL (ref 30.0–36.0)
MCV: 92 fL (ref 80.0–100.0)
Monocytes Absolute: 0.6 10*3/uL (ref 0.1–1.0)
Monocytes Relative: 9 %
Neutro Abs: 2.5 10*3/uL (ref 1.7–7.7)
Neutrophils Relative %: 40 %
Platelets: 319 10*3/uL (ref 150–400)
RBC: 4.01 MIL/uL (ref 3.87–5.11)
RDW: 11.8 % (ref 11.5–15.5)
WBC: 6.2 10*3/uL (ref 4.0–10.5)
nRBC: 0 % (ref 0.0–0.2)

## 2020-11-18 LAB — COMPREHENSIVE METABOLIC PANEL
ALT: 14 U/L (ref 0–44)
AST: 16 U/L (ref 15–41)
Albumin: 4.2 g/dL (ref 3.5–5.0)
Alkaline Phosphatase: 55 U/L (ref 38–126)
Anion gap: 11 (ref 5–15)
BUN: 7 mg/dL (ref 6–20)
CO2: 21 mmol/L — ABNORMAL LOW (ref 22–32)
Calcium: 8.8 mg/dL — ABNORMAL LOW (ref 8.9–10.3)
Chloride: 108 mmol/L (ref 98–111)
Creatinine, Ser: 0.82 mg/dL (ref 0.44–1.00)
GFR, Estimated: >60 mL/min (ref >60)
Glucose, Bld: 105 mg/dL — ABNORMAL HIGH (ref 70–99)
Potassium: 3.3 mmol/L — ABNORMAL LOW (ref 3.5–5.1)
Sodium: 140 mmol/L (ref 135–145)
Total Bilirubin: 0.5 mg/dL (ref 0.3–1.2)
Total Protein: 6.8 g/dL (ref 6.5–8.1)

## 2020-11-18 LAB — RAPID URINE DRUG SCREEN, HOSP PERFORMED
Amphetamines: NOT DETECTED
Barbiturates: NOT DETECTED
Benzodiazepines: NOT DETECTED
Cocaine: NOT DETECTED
Opiates: NOT DETECTED
Tetrahydrocannabinol: POSITIVE — AB

## 2020-11-18 LAB — ACETAMINOPHEN LEVEL: Acetaminophen (Tylenol), Serum: <10 ug/mL — ABNORMAL LOW (ref 10–30)

## 2020-11-18 LAB — RESP PANEL BY RT-PCR (FLU A&B, COVID) ARPGX2
Influenza A by PCR: NEGATIVE
Influenza B by PCR: NEGATIVE
SARS Coronavirus 2 by RT PCR: NEGATIVE

## 2020-11-18 LAB — I-STAT BETA HCG BLOOD, ED (MC, WL, AP ONLY): I-stat hCG, quantitative: <5 m[IU]/mL (ref <5)

## 2020-11-18 LAB — ETHANOL: Alcohol, Ethyl (B): <10 mg/dL (ref <10)

## 2020-11-18 LAB — SALICYLATE LEVEL: Salicylate Lvl: <7 mg/dL — ABNORMAL LOW (ref 7.0–30.0)

## 2020-11-18 MED ORDER — ZIPRASIDONE MESYLATE 20 MG IM SOLR
20.0000 mg | Freq: Once | INTRAMUSCULAR | Status: AC
  Administered 2020-11-18: 20 mg via INTRAMUSCULAR
  Filled 2020-11-18: qty 20

## 2020-11-18 MED ORDER — STERILE WATER FOR INJECTION IJ SOLN
INTRAMUSCULAR | Status: AC
  Filled 2020-11-18: qty 10

## 2020-11-18 MED ORDER — ACETAMINOPHEN 325 MG PO TABS
650.0000 mg | ORAL_TABLET | ORAL | Status: DC | PRN
  Administered 2020-11-19 (×2): 650 mg via ORAL
  Filled 2020-11-18 (×2): qty 2

## 2020-11-18 MED ORDER — NICOTINE 21 MG/24HR TD PT24
21.0000 mg | MEDICATED_PATCH | Freq: Once | TRANSDERMAL | Status: AC
  Administered 2020-11-18: 21 mg via TRANSDERMAL
  Filled 2020-11-18: qty 1

## 2020-11-18 NOTE — ED Notes (Signed)
Patient cooperative but tearful upon admission to unit.  Patient states, "My mother has PTSD because my brother Theone Murdoch shot himself in the head and she refuses to leave the apartment where he shot himself.

## 2020-11-18 NOTE — ED Provider Notes (Signed)
[facility]-EMERGENCY DEPT Provider Note   CSN: [id number] Arrival date & time: 11/18/20  1448     History Chief Complaint  Patient presents with  . Psychiatric Evaluation    Carol Miles is a 30 y.o. female.  HPI  Patient has past psychiatric history significant for bipolar disorder, polysubstance use disorders as well as posttraumatic stress disorder. The patient had psychiatric hospitalizations July, August and October 2021. Today, she is brought by EMS with report of suicidal ideation and homicidal ideation. She reported intent to kill herself by overdosing on Tylenol and kill her father-in-law with a golf club. Reportedly patient snorted methamphetamine this morning. At the time of my interview, patient is escalated and tangential. She reports she will not look behind her because she is having visual hallucinations which is atypical for her. She reports she feels like she is going to see demons behind her. She then escalated into a rapid and hostile conversation containing many tangential references, punctuated by grimaces, screams and clenching of the arms of the chair. At this time, it is unclear what portion of this is delusional versus historical reality.    Past Medical History:  Diagnosis Date  . Bipolar 1 disorder (HCC)   . Depression   . HPV (human papilloma virus) anogenital infection   . PTSD (post-traumatic stress disorder)     Patient Active Problem List   Diagnosis Date Noted  . Acute psychosis (HCC) 07/20/2020  . Bipolar I disorder, most recent episode mixed (HCC) 06/18/2020  . Affective psychosis, bipolar (HCC) 06/10/2020  . Amphetamine use disorder, severe (HCC)   . Bipolar disorder (HCC) 05/09/2020  . History of bipolar disorder 05/18/2019  . Bipolar disorder, current episode mixed, moderate (HCC) 02/27/2019  . Moderate benzodiazepine use disorder (HCC) 02/27/2019  . Polysubstance abuse (HCC) 02/27/2019  . Tobacco use disorder  02/27/2019  . Methamphetamine use disorder, severe (HCC) 02/25/2019  . Bipolar disorder, most recent episode hypomanic (HCC) 09/02/2018  . Severe cannabis use disorder (HCC) 09/02/2018    Past Surgical History:  Procedure Laterality Date  . HERNIA REPAIR       OB History   No obstetric history on file.     Family History  Problem Relation Age of Onset  . OCD Brother     Social History   Tobacco Use  . Smoking status: Current Every Day Smoker    Packs/day: 1.00    Years: 6.00    Pack years: 6.00    Types: Cigarettes  . Smokeless tobacco: Never Used  Vaping Use  . Vaping Use: Never used  Substance Use Topics  . Alcohol use: Yes    Comment: occ has a beer   . Drug use: Yes    Types: Methamphetamines, Marijuana    Comment: heroin    Home Medications Prior to Admission medications   Medication Sig Start Date End Date Taking? Authorizing Provider  fluPHENAZine (PROLIXIN) 10 MG tablet Take 1 tablet (10 mg total) by mouth at bedtime. 07/29/20   Aldean Baker, NP  hydrOXYzine (ATARAX/VISTARIL) 25 MG tablet Take 1 tablet (25 mg total) by mouth 3 (three) times daily as needed for anxiety. 07/29/20   Aldean Baker, NP  nicotine polacrilex (NICORETTE) 2 MG gum Take 1 each (2 mg total) by mouth as needed for smoking cessation. 07/29/20   Aldean Baker, NP  Oxcarbazepine (TRILEPTAL) 300 MG tablet Take 1 tablet (300 mg total) by mouth at bedtime. 07/29/20   Aldean Baker,  NP  lurasidone (LATUDA) 20 MG TABS tablet Take by mouth. 06/18/19 04/25/20  [provider]    Allergies    Amoxicillin, Banana, Penicillins, and Sulfa antibiotics  Review of Systems   Review of Systems Level 5 caveat cannot obtain review of systems due to acute psychosis Physical Exam Updated Vital Signs BP (!) 141/93   Pulse (!) 110   Temp 98.4 F (36.9 C)   Resp (!) 22   Physical Exam Constitutional:      Comments: Alert and hypervigilant. Well-nourished well-developed. Appears to be in  good physical condition. No respiratory distress. Reasonably well-groomed.  HENT:     Head: Normocephalic and atraumatic.  Eyes:     Extraocular Movements: Extraocular movements intact.  Cardiovascular:     Rate and Rhythm: Regular rhythm. Tachycardia present.  Pulmonary:     Effort: Pulmonary effort is normal.     Breath sounds: Normal breath sounds.  Abdominal:     General: There is no distension.     Palpations: Abdomen is soft.     Tenderness: There is no abdominal tenderness.  Musculoskeletal:        General: Normal range of motion.  Skin:    General: Skin is warm and dry.  Neurological:     General: No focal deficit present.     Comments: Patient is alert. Speech is pressured and tangential but clear with normal syntax. Patient is using all 4 extremities and very coordinated fashion. No signs of incoordination or focal motor deficits.  Psychiatric:     Comments: Highly agitated. Rapidly cycling from tearful to angry and yelling.     ED Results / Procedures / Treatments   Labs (all labs ordered are listed, but only abnormal results are displayed) Labs Reviewed  COMPREHENSIVE METABOLIC PANEL - Abnormal; Notable for the following components:      Result Value   Potassium 3.3 (*)    CO2 21 (*)    Glucose, Bld 105 (*)    Calcium 8.8 (*)    All other components within normal limits  RAPID URINE DRUG SCREEN, HOSP PERFORMED - Abnormal; Notable for the following components:   Tetrahydrocannabinol POSITIVE (*)    All other components within normal limits  ACETAMINOPHEN LEVEL - Abnormal; Notable for the following components:   Acetaminophen (Tylenol), Serum <10 (*)    All other components within normal limits  SALICYLATE LEVEL - Abnormal; Notable for the following components:   Salicylate Lvl <7.0 (*)    All other components within normal limits  RESP PANEL BY RT-PCR (FLU A&B, COVID) [id number]  ETHANOL  CBC WITH DIFFERENTIAL/PLATELET  I-STAT BETA HCG BLOOD, ED ([facility], [facility], [facility]  ONLY)    EKG EKG Interpretation  Date/Time:  Tuesday November 18 2020 08:48:21 EST Ventricular Rate:  104 PR Interval:    QRS Duration: 80 QT Interval:  340 QTC Calculation: 448 R Axis:   80 Text Interpretation: Sinus tachycardia Borderline T wave abnormalities 1no sig change from previous Confirmed by Arby Barrette [phone]) on 11/18/2020 9:54:53 AM   Radiology No results found.  Procedures Procedures   Medications Ordered in ED Medications  acetaminophen (TYLENOL) tablet 650 mg (has no administration in time range)  nicotine (NICODERM CQ - dosed in mg/24 hours) patch 21 mg (0 mg Transdermal Hold 11/18/20 1051)  ziprasidone (GEODON) injection 20 mg (20 mg Intramuscular Given 11/18/20 0817)  sterile water (preservative free) injection (  Given 11/18/20 0859)    ED Course  I have reviewed the triage  vital signs and the nursing notes.  Pertinent labs & imaging results that were available during my care of the patient were reviewed by me and considered in my medical decision making (see chart for details).    MDM Rules/Calculators/A&P                          Patient presents acutely psychotic. Patient has history of very similar presentations with polysubstance abuse and particularly methamphetamine. Before my evaluation, there was report of methamphetamine use this morning. At this time, appears to be mixed picture of psychosis either primarily due to depression\anxiety or secondary to acute drug use. Patient has cited both suicidal and homicidal intent. We'll proceed to clear medically for psychiatric evaluation. At this time, due to significant agitation with yelling, grimacing and gesticulating, will administer Geodon 20 mg IM for safety of the patient and staff.  Plan will be for observation for psychosis, threats of suicidal and homicidal intent.  Medically, patient is well in appearance without appearance of acute medical complications.  IVC paperwork completed.   Final  Clinical Impression(s) / ED Diagnoses Final diagnoses:  Depression, unspecified depression type  Psychosis, unspecified psychosis type (HCC)  Polysubstance abuse (HCC)    Rx / DC Orders ED Discharge Orders    None       Arby Barrette, MD 11/18/20 1312

## 2020-11-18 NOTE — ED Triage Notes (Signed)
Pt arrived via EMS, from home, states she snorted methamphetamines this morning. Endorses SI/HI towards her father in law and self. Plan to OD on tylenol and assault father in law with golf club.

## 2020-11-18 NOTE — ED Notes (Addendum)
Pt. Unable to urinate at this time. Will collect urine when pt. voids. Nurses aware.

## 2020-11-18 NOTE — BH Assessment (Signed)
[facility] Assessment Progress Note  Per Shuvon Rankin, NP, this pt is to be held overnight at [facility] for further observation and stabilization.  She reports that she has informed EDP Arby Barrette, MD, who is currently initiating IVC.  Pt's nurses, [place] and [place], have been notified.  Doylene Canning, [place] Behavioral Health Coordinator [phone]

## 2020-11-18 NOTE — ED Notes (Signed)
After TTS consult finished, pt out of room, attempted to leave, yelling at triage desk, states she needs to leave. Attempting to strike staff. Nanavati at bedside. Pt to be IVC'd. Pt back to room.

## 2020-11-18 NOTE — ED Notes (Signed)
Patient taking a shower.

## 2020-11-18 NOTE — ED Notes (Signed)
Pt. Belongings are labeled in the storage cabinet in triage  1 Peach hoodie 1 Black pants 1 pair of diamond earrings  Nurse aware.
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
## 2020-11-18 NOTE — ED Notes (Signed)
Pt noted to be in her room screaming.  Security outside room on Standby.

## 2020-11-18 NOTE — ED Notes (Signed)
Patient came out of her room and began yelling at the staff. Attempted to redirect patient and she continued to yell and be uncooperative with staff. MD made aware.

## 2020-11-19 ENCOUNTER — Encounter (HOSPITAL_COMMUNITY): Payer: Self-pay | Admitting: Registered Nurse

## 2020-11-19 DIAGNOSIS — F3160 Bipolar disorder, current episode mixed, unspecified: Secondary | ICD-10-CM

## 2020-11-19 DIAGNOSIS — F1520 Other stimulant dependence, uncomplicated: Secondary | ICD-10-CM

## 2020-11-19 DIAGNOSIS — F32A Depression, unspecified: Secondary | ICD-10-CM

## 2020-11-19 MED ORDER — ALPRAZOLAM 0.5 MG PO TABS
0.5000 mg | ORAL_TABLET | Freq: Once | ORAL | Status: AC
  Administered 2020-11-19: 0.5 mg via ORAL
  Filled 2020-11-19: qty 1

## 2020-11-19 MED ORDER — NICOTINE 14 MG/24HR TD PT24
14.0000 mg | MEDICATED_PATCH | Freq: Once | TRANSDERMAL | Status: AC
  Administered 2020-11-19: 14 mg via TRANSDERMAL
  Filled 2020-11-19: qty 1

## 2020-11-19 MED ORDER — LORAZEPAM 2 MG/ML IJ SOLN
2.0000 mg | Freq: Once | INTRAMUSCULAR | Status: AC
  Administered 2020-11-19: 2 mg via INTRAMUSCULAR
  Filled 2020-11-19: qty 1

## 2020-11-19 MED ORDER — HALOPERIDOL LACTATE 5 MG/ML IJ SOLN
5.0000 mg | Freq: Once | INTRAMUSCULAR | Status: AC
  Administered 2020-11-19: 5 mg via INTRAMUSCULAR
  Filled 2020-11-19: qty 1

## 2020-11-19 MED ORDER — DIPHENHYDRAMINE HCL 50 MG/ML IJ SOLN
25.0000 mg | Freq: Once | INTRAMUSCULAR | Status: AC
  Administered 2020-11-19: 25 mg via INTRAMUSCULAR
  Filled 2020-11-19: qty 1

## 2020-11-19 NOTE — Consult Note (Signed)
Telepsych Consultation   Reason for Consult:  Suicidal ideation Referring Physician:  Arby Barrette, MD Location of Patient:  [facility] ED Location of Provider: Other: [facility]  Patient Identification: Carol Miles MRN:  [id number] Principal Diagnosis: <principal problem not specified> Diagnosis:  Active Problems:   * No active hospital problems. *   Total Time spent with patient: 30 minutes  Subjective:   Carol Miles is a 30 y.o. female patient presented to [place] [facility] ED via EMS with complaints of suicidal and homicidal ideation.    HPI:  Carol Miles, 30 y.o., female patient seen via tele health by this provider, consulted with Dr. Lucianne Muss; and chart reviewed on 11/19/20.  On evaluation Carol Miles reports she is in the hospital to get detox.  Patient states that her mother found her brother dead at that he shot himself in the head.  Patient stated she wanted to clean so she can go to the funeral.  At this time patient denies suicidal/self-harm/homicidal ideation and states she only wants to detox so she can go to her brothers funeral.  Then patient states that she has thoughts of hurting people but doesn't want to kill them.  Patient gets angry with questions on what does she feel she needs to help her get better "Nothing bitch, I just want to stay right here and detox.  I don't want no damn rehab.  Patient refuse to answer if she had outpatient psychiatric services.  Patient states once she is discharged she will go to her mother house. During evaluation Carol Miles is sitting up in bed in no acute distress.  She is alert, oriented x 4, calm but easily agitated.  Her mood is irritable.  She does not appear to be responding to internal/external stimuli or delusional thoughts.  Patient denies suicidal/self-harm/homicidal ideation, psychosis, and paranoia; bot does appear to have some disorganization when discussing plans and where she lives and where she will go once  discharged.  Gave permission to speak to her mother for collateral information.    Collateral Information:  Spoke to patients mother Noreene Larsson at [phone].  Patient mother states that patients brother was found dead on 2024-01-26where he had killed himself and the funeral is scheduled for Saturday.  States that she doesn't feel that the patient will be able to handle the funeral and feels that the patient needs to be admitted to psychiatric hospital.  States that patient does have a drug problem and was working on things prior to her brothers death but feels that the patient may be a danger to her self related to being overwhelmed with grief and doesn't feel that the patient will be safe at home.    Past Psychiatric History: *  Risk to Self:  yes Risk to Others:  Yes Prior Inpatient Therapy:  Yes Prior Outpatient Therapy:  Yes  Past Medical History:  Past Medical History:  Diagnosis Date  . Bipolar 1 disorder (HCC)   . Depression   . HPV (human papilloma virus) anogenital infection   . PTSD (post-traumatic stress disorder)     Past Surgical History:  Procedure Laterality Date  . HERNIA REPAIR     Family History:  Family History  Problem Relation Age of Onset  . OCD Brother    Family Psychiatric  History: See above Social History:  Social History   Substance and Sexual Activity  Alcohol Use Yes   Comment: occ has a beer  Social History   Substance and Sexual Activity  Drug Use Yes  . Types: Methamphetamines, Marijuana   Comment: heroin    Social History   Socioeconomic History  . Marital status: Single    Spouse name: Not on file  . Number of children: Not on file  . Years of education: Not on file  . Highest education level: Not on file  Occupational History  . Not on file  Tobacco Use  . Smoking status: Current Every Day Smoker    Packs/day: 1.00    Years: 6.00    Pack years: 6.00    Types: Cigarettes  . Smokeless tobacco: Never Used  Vaping Use  .  Vaping Use: Never used  Substance and Sexual Activity  . Alcohol use: Yes    Comment: occ has a beer   . Drug use: Yes    Types: Methamphetamines, Marijuana    Comment: heroin  . Sexual activity: Not Currently  Other Topics Concern  . Not on file  Social History Narrative  . Not on file   Social Determinants of Health   Financial Resource Strain: Not on file  Food Insecurity: Not on file  Transportation Needs: Not on file  Physical Activity: Not on file  Stress: Not on file  Social Connections: Not on file   Additional Social History:    Allergies:   Allergies  Allergen Reactions  . Amoxicillin Hives  . Banana   . Penicillins Hives    Childhood allergy-patient remembers taking Amoxicillin with no side effects. Did it involve swelling of the face/tongue/throat, SOB, or low BP? No Did it involve sudden or severe rash/hives, skin peeling, or any reaction on the inside of your mouth or nose? No Did you need to seek medical attention at a hospital or doctor's office? Unknown When did it last happen?childhood If all above answers are "NO", may proceed with cephalosporin use.   . Sulfa Antibiotics Nausea And Vomiting    Labs:  Results for orders placed or performed during the hospital encounter of 11/18/20 (from the past 48 hour(s))  Resp Panel by RT-PCR (Flu A&B, Covid) Nasopharyngeal Swab     Status: None   Collection Time: 11/18/20  8:52 AM   Specimen: Nasopharyngeal Swab; Nasopharyngeal(NP) swabs in vial transport medium  Result Value Ref Range   SARS Coronavirus 2 by RT PCR NEGATIVE NEGATIVE    Comment: (NOTE) SARS-CoV-2 target nucleic acids are NOT DETECTED.  The SARS-CoV-2 RNA is generally detectable in upper respiratory specimens during the acute phase of infection. The lowest concentration of SARS-CoV-2 viral copies this assay can detect is 138 copies/mL. A negative result does not preclude SARS-Cov-2 infection and should not be used as the sole basis  for treatment or other patient management decisions. A negative result may occur with  improper specimen collection/handling, submission of specimen other than nasopharyngeal swab, presence of viral mutation(s) within the areas targeted by this assay, and inadequate number of viral copies(<138 copies/mL). A negative result must be combined with clinical observations, patient history, and epidemiological information. The expected result is Negative.  Fact Sheet for Patients:  BloggerCourse.com  Fact Sheet for Healthcare Providers:  SeriousBroker.it  This test is no t yet approved or cleared by the [place] FDA and  has been authorized for detection and/or diagnosis of SARS-CoV-2 by FDA under an Emergency Use Authorization (EUA). This EUA will remain  in effect (meaning this test can be used) for the duration of the COVID-19 declaration under Section  564(b)(1) of the Act, 21 U.S.C.section 360bbb-3(b)(1), unless the authorization is terminated  or revoked sooner.       Influenza A by PCR NEGATIVE NEGATIVE   Influenza B by PCR NEGATIVE NEGATIVE    Comment: (NOTE) The Xpert Xpress SARS-CoV-2/FLU/RSV plus assay is intended as an aid in the diagnosis of influenza from Nasopharyngeal swab specimens and should not be used as a sole basis for treatment. Nasal washings and aspirates are unacceptable for Xpert Xpress SARS-CoV-2/FLU/RSV testing.  Fact Sheet for Patients: BloggerCourse.com  Fact Sheet for Healthcare Providers: SeriousBroker.it  This test is not yet approved or cleared by the [place] FDA and has been authorized for detection and/or diagnosis of SARS-CoV-2 by FDA under an Emergency Use Authorization (EUA). This EUA will remain in effect (meaning this test can be used) for the duration of the COVID-19 declaration under Section 564(b)(1) of the Act, 21  U.S.C. section 360bbb-3(b)(1), unless the authorization is terminated or revoked.  Performed at [facility], [address], [place], [place] [address]   Comprehensive metabolic panel     Status: Abnormal   Collection Time: 11/18/20  8:52 AM  Result Value Ref Range   Sodium 140 135 - 145 mmol/L   Potassium 3.3 (L) 3.5 - 5.1 mmol/L   Chloride 108 98 - 111 mmol/L   CO2 21 (L) 22 - 32 mmol/L   Glucose, Bld 105 (H) 70 - 99 mg/dL    Comment: Glucose reference range applies only to samples taken after fasting for at least 8 hours.   BUN 7 6 - 20 mg/dL   Creatinine, Ser 1.94 0.44 - 1.00 mg/dL   Calcium 8.8 (L) 8.9 - 10.3 mg/dL   Total Protein 6.8 6.5 - 8.1 g/dL   Albumin 4.2 3.5 - 5.0 g/dL   AST 16 15 - 41 U/L   ALT 14 0 - 44 U/L   Alkaline Phosphatase 55 38 - 126 U/L   Total Bilirubin 0.5 0.3 - 1.2 mg/dL   GFR, Estimated >17 >40 mL/min    Comment: (NOTE) Calculated using the CKD-EPI Creatinine Equation (2021)    Anion gap 11 5 - 15    Comment: Performed at [facility], [address], [place], [place] [address]  CBC with Diff     Status: None   Collection Time: 11/18/20  8:52 AM  Result Value Ref Range   WBC 6.2 4.0 - 10.5 K/uL   RBC 4.01 3.87 - 5.11 MIL/uL   Hemoglobin 12.7 12.0 - 15.0 g/dL   HCT 18.5 63.1 - 49.7 %   MCV 92.0 80.0 - 100.0 fL   MCH 31.7 26.0 - 34.0 pg   MCHC 34.4 30.0 - 36.0 g/dL   RDW 02.6 37.8 - 58.8 %   Platelets 319 150 - 400 K/uL   nRBC 0.0 0.0 - 0.2 %   Neutrophils Relative % 40 %   Neutro Abs 2.5 1.7 - 7.7 K/uL   Lymphocytes Relative 45 %   Lymphs Abs 2.8 0.7 - 4.0 K/uL   Monocytes Relative 9 %   Monocytes Absolute 0.6 0.1 - 1.0 K/uL   Eosinophils Relative 5 %   Eosinophils Absolute 0.3 0.0 - 0.5 K/uL   Basophils Relative 1 %   Basophils Absolute 0.1 0.0 - 0.1 K/uL   Immature Granulocytes 0 %   Abs Immature Granulocytes 0.01 0.00 - 0.07 K/uL    Comment: Performed at [facility], [address] [place], [place]  [id number]  Ethanol     Status: None   Collection Time: 11/18/20  8:53 AM  Result Value Ref Range   Alcohol, Ethyl (B) <10 <10 mg/dL    Comment: (NOTE) Lowest detectable limit for serum alcohol is 10 mg/dL.  For medical purposes only. Performed at [facility], [address] [place], [place] [address]   Urine rapid drug screen (hosp performed)     Status: Abnormal   Collection Time: 11/18/20  8:53 AM  Result Value Ref Range   Opiates NONE DETECTED NONE DETECTED   Cocaine NONE DETECTED NONE DETECTED   Benzodiazepines NONE DETECTED NONE DETECTED   Amphetamines NONE DETECTED NONE DETECTED   Tetrahydrocannabinol POSITIVE (A) NONE DETECTED   Barbiturates NONE DETECTED NONE DETECTED    Comment: (NOTE) DRUG SCREEN FOR MEDICAL PURPOSES ONLY.  IF CONFIRMATION IS NEEDED FOR ANY PURPOSE, NOTIFY LAB WITHIN 5 DAYS.  LOWEST DETECTABLE LIMITS FOR URINE DRUG SCREEN Drug Class                     Cutoff (ng/mL) Amphetamine and metabolites    1000 Barbiturate and metabolites    200 Benzodiazepine                 200 Tricyclics and metabolites     300 Opiates and metabolites        300 Cocaine and metabolites        300 THC                            50 Performed at [facility], [address] [place], [place] [address]   Acetaminophen level     Status: Abnormal   Collection Time: 11/18/20  8:53 AM  Result Value Ref Range   Acetaminophen (Tylenol), Serum <10 (L) 10 - 30 ug/mL    Comment: (NOTE) Therapeutic concentrations vary significantly. A range of 10-30 ug/mL  may be an effective concentration for many patients. However, some  are best treated at concentrations outside of this range. Acetaminophen concentrations >150 ug/mL at 4 hours after ingestion  and >50 ug/mL at 12 hours after ingestion are often associated with  toxic reactions.  Performed at [facility], [address] [place], [place] [address]   Salicylate level     Status: Abnormal   Collection Time: 11/18/20  8:53 AM  Result Value Ref Range   Salicylate Lvl <7.0 (L) 7.0 - 30.0 mg/dL    Comment: Performed at [facility], [address] [place], [place] [address]  I-Stat beta hCG blood, ED     Status: None   Collection Time: 11/18/20  9:05 AM  Result Value Ref Range   I-stat hCG, quantitative <5.0 <5 mIU/mL   Comment 3            Comment:   GEST. AGE      CONC.  (mIU/mL)   <=1 WEEK        5 - 50     2 WEEKS       50 - 500     3 WEEKS       100 - 10,000     4 WEEKS     1,000 - 30,000        FEMALE AND NON-PREGNANT FEMALE:     LESS THAN 5 mIU/mL     Medications:  Current Facility-Administered Medications  Medication Dose Route  Frequency Provider Last Rate Last Admin  . acetaminophen (TYLENOL) tablet 650 mg  650 mg Oral Q4H PRN Arby Barrette, MD   650 mg at 11/19/20 1118  . nicotine (NICODERM CQ - dosed in mg/24 hours) patch 21 mg  21 mg Transdermal Once Derwood Kaplan, MD   21 mg at 11/18/20 1646   Current Outpatient Medications  Medication Sig Dispense Refill  . fluPHENAZine (PROLIXIN) 10 MG tablet Take 1 tablet (10 mg total) by mouth at bedtime. 30 tablet 0  . hydrOXYzine (ATARAX/VISTARIL) 25 MG tablet Take 1 tablet (25 mg total) by mouth 3 (three) times daily as needed for anxiety. 30 tablet 0  . nicotine polacrilex (NICORETTE) 2 MG gum Take 1 each (2 mg total) by mouth as needed for smoking cessation. 100 tablet 0  . Oxcarbazepine (TRILEPTAL) 300 MG tablet Take 1 tablet (300 mg total) by mouth at bedtime. 30 tablet 0    Musculoskeletal: Strength & Muscle Tone: within normal limits Gait & Station: normal Patient leans: N/A  Psychiatric Specialty Exam: Physical Exam Vitals and nursing note reviewed. Chaperone present: Sitter at bedside.  Constitutional:      General: She is not in acute distress.    Appearance: Normal appearance. She is not ill-appearing.   HENT:     Head: Normocephalic.  Cardiovascular:     Rate and Rhythm: Normal rate.  Pulmonary:     Effort: Pulmonary effort is normal.  Musculoskeletal:        General: Normal range of motion.     Cervical back: Normal range of motion.  Skin:    General: Skin is warm and dry.  Neurological:     Mental Status: She is alert and oriented to person, place, and time.     Review of Systems  Blood pressure 113/72, pulse 79, temperature 98.3 F (36.8 C), temperature source Oral, resp. rate (!) 21, SpO2 100 %.There is no height or weight on file to calculate BMI.  General Appearance: Casual  Eye Contact:  Good  Speech:  Clear and Coherent and Normal Rate  Volume:  Increased  Mood:  Angry, Anxious and Irritable  Affect:  Congruent  Thought Process:  Coherent, Linear and Descriptions of Associations: Circumstantial  Orientation:  Full (Time, Place, and Person)  Thought Content:  WDL  Suicidal Thoughts:  Patient going back and forth with comments of suicidal ideation; but eventually stated she was not  Homicidal Thoughts:  Thoughts of hurting others but states she doesn't want to kill  Memory:  Immediate;   Fair Recent;   Fair  Judgement:  Fair  Insight:  Lacking and Shallow  Psychomotor Activity:  Normal  Concentration:  Concentration: Fair and Attention Span: Fair  Recall:  [employer] of Knowledge:  Fair  Language:  Good  Akathisia:  No  Handed:  Right  AIMS (if indicated):     Assets:  Communication Skills Desire for Improvement Housing Social Support  ADL's:  Intact  Cognition:  WNL  Sleep:        Treatment Plan Summary: Daily contact with patient to assess and evaluate symptoms and progress in treatment, Medication management and Plan Inpatient  Disposition: Recommend psychiatric Inpatient admission when medically cleared.  This service was provided via telemedicine using a 2-way, interactive audio and video technology.  Names of all persons participating in this  telemedicine service and their role in this encounter. Name: Assunta Found Role: NP  Name: Dr. Nelly Rout Role: Psychiatrist  Name: Yehuda Savannah Role:  Patient  Name: Stephannie Peters  Role: Mother   Secure message sent to Dr. Stevie Kern informing:  Patient continues to need inpatient psychiatric treatment   Assunta Found, NP 11/19/2020 12:27 PM

## 2020-11-19 NOTE — BH Assessment (Signed)
BHH Assessment Progress Note  Per Shuvon Rankin, NP this pt requires psychiatric hospitalization at this time.  Pt presents under IVC initiated by EDP Arby Barrette, MD.  Pt has been submitted to [facility] for consideration for admission.  Decision pending as of this writing.  Doylene Canning, [place] Behavioral Health Coordinator [phone]

## 2020-11-19 NOTE — ED Notes (Addendum)
Patient took injections willingly. Stating "If this is Geodon I'm going to sue."  When writer advised patient she was receiving Ativan, Benadryl, and Haldol she was accepting.  Patient's escalation appears to coincide with her desire for Ativan.
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
## 2020-11-19 NOTE — Progress Notes (Signed)
Patient verbally threatening staff stating "I will kill you and not think twice about it." "I'm criminally insane.  It's not my fault.  I"m nothing but a criminal."

## 2020-11-19 NOTE — Progress Notes (Signed)
11/19/2020  1520  Patient requested to speak with a chaplain. Chaplain at bedside.

## 2020-11-19 NOTE — ED Provider Notes (Signed)
Emergency Medicine Observation Re-evaluation Note  Carol Miles is a 30 y.o. female, seen on rounds today.  Pt initially presented to the ED for complaints of Psychiatric Evaluation Currently, the patient is resting.  Physical Exam  BP 113/72 (BP Location: Left Arm)   Pulse 79   Temp 98.3 F (36.8 C) (Oral)   Resp (!) 21   SpO2 100%  Physical Exam General: resting Cardiac: warm and well perfused Lungs: even and unlabordd Psych: calm at present, agitated earlier  ED Course / MDM  EKG:EKG Interpretation  Date/Time:  Tuesday November 18 2020 08:48:21 EST Ventricular Rate:  104 PR Interval:    QRS Duration: 80 QT Interval:  340 QTC Calculation: 448 R Axis:   80 Text Interpretation: Sinus tachycardia Borderline T wave abnormalities 1no sig change from previous Confirmed by Arby Barrette [phone]) on 11/18/2020 9:54:53 AM    I have reviewed the labs performed to date as well as medications administered while in observation.  Recent changes in the last 24 hours include very agitated overnight, yesterday psych recommended overnight obs, reassess in am.  Plan  Current plan is for reassessment in am by psych Patient is under full IVC at this time.   Milagros Loll, MD 11/19/20 0930

## 2020-11-19 NOTE — ED Notes (Signed)
Patient attention seeking for staff interaction.

## 2020-11-19 NOTE — ED Notes (Signed)
Patient is very agitated at this time aggressive pt talking loudly and refused to cooperate with staff slammed the door hard time x 2

## 2020-11-20 MED ORDER — CARBAMAZEPINE ER 200 MG PO TB12
200.0000 mg | ORAL_TABLET | Freq: Two times a day (BID) | ORAL | Status: DC
  Filled 2020-11-20: qty 1

## 2020-11-20 MED ORDER — OLANZAPINE 5 MG PO TABS
5.0000 mg | ORAL_TABLET | ORAL | Status: AC
  Administered 2020-11-20: 5 mg via ORAL
  Filled 2020-11-20: qty 1

## 2020-11-20 MED ORDER — ZIPRASIDONE MESYLATE 20 MG IM SOLR: 20.0000 mg | Freq: Once | INTRAMUSCULAR | Status: DC | PRN

## 2020-11-20 MED ORDER — OLANZAPINE 5 MG PO TBDP
5.0000 mg | ORAL_TABLET | Freq: Two times a day (BID) | ORAL | Status: DC
  Administered 2020-11-20 – 2020-11-21 (×3): 5 mg via ORAL
  Filled 2020-11-20 (×3): qty 1

## 2020-11-20 MED ORDER — LORAZEPAM 1 MG PO TABS
1.0000 mg | ORAL_TABLET | Freq: Once | ORAL | Status: DC | PRN
Start: 2020-11-20 — End: 2020-11-21

## 2020-11-20 MED ORDER — ZIPRASIDONE MESYLATE 20 MG IM SOLR
20.0000 mg | Freq: Once | INTRAMUSCULAR | Status: DC
Start: 2020-11-20 — End: 2020-11-20

## 2020-11-20 NOTE — Consult Note (Cosign Needed Addendum)
Telepsych Consultation   Reason for Consult:  Depression Referring Physician:  EPD  Location of [place]: [place] Location of Provider: [facility]  Patient Identification: KHUSHBOO CHUCK MRN:  [id number] Principal Diagnosis: <principal problem not specified> Diagnosis:  Active Problems:   Amphetamine use disorder, severe (HCC)   Bipolar I disorder, most recent episode mixed (HCC)   Moderate benzodiazepine use disorder (HCC)   Polysubstance abuse (HCC)   Severe cannabis use disorder (HCC)   Methamphetamine use disorder, severe (HCC)   Total Time spent with patient: 15 minutes  Subjective:   NEELAM TIGGS is a 30 y.o. female was seen and evaluated via teleassessment.  She continues to present irritable, labile and slightly disorganized.  Patient refused to answer any questions. " get out of here you dumb bitch." Takiera did provide verbal permission to follow-up with her mother  Noreene Larsson) who has concerns with patient's drug addiction. Will continue to recommend inpatient admission.  Patient to start Zyprexa 5 mg p.o. twice daily and Tegretol 200 mg p.o. twice daily for mood stabilization.  See chart for agitation protocols.  HPI:  Per admission assessment note: NASHLA ALTHOFF, 30 y.o., female patient seen via tele health by this provider, consulted with Dr. Lucianne Muss; and chart reviewed on 11/19/20.  On evaluation LUCITA MONTOYA reports she is in the hospital to get detox.  Patient states that her mother found her brother dead at that he shot himself in the head.  Patient stated she wanted to clean so she can go to the funeral.  At this time patient denies suicidal/self-harm/homicidal ideation and states she only wants to detox so she can go to her brothers funeral.  Then patient states that she has thoughts of hurting people but doesn't want to kill them.  Patient gets angry with questions on what does she feel she needs to help her get better "Nothing bitch, I just want to  stay right here and detox.  I don't want no damn rehab.  Patient refuse to answer if she had outpatient psychiatric services.  Patient states once she is discharged she will go to her mother house. During evaluation KANA REIMANN is sitting up in bed in no acute distress.  She is alert, oriented x 4, calm but easily agitated.  Her mood is irritable.  She does not appear to be responding to internal/external stimuli or delusional thoughts.  Patient denies suicidal/self-harm/homicidal ideation, psychosis, and paranoia; bot does appear to have some disorganization when discussing plans and where she lives and where she will go once discharged.  Gave permission to speak to her mother for collateral information  Past Psychiatric History:   Risk to Self:   Risk to Others:   Prior Inpatient Therapy:   Prior Outpatient Therapy:    Past Medical History:  Past Medical History:  Diagnosis Date  . Bipolar 1 disorder (HCC)   . Depression   . HPV (human papilloma virus) anogenital infection   . PTSD (post-traumatic stress disorder)     Past Surgical History:  Procedure Laterality Date  . HERNIA REPAIR     Family History:  Family History  Problem Relation Age of Onset  . OCD Brother    Family Psychiatric  History:  Social History:  Social History   Substance and Sexual Activity  Alcohol Use Yes   Comment: occ has a beer      Social History   Substance and Sexual Activity  Drug Use Yes  . Types: Methamphetamines,  Marijuana   Comment: heroin    Social History   Socioeconomic History  . Marital status: Single    Spouse name: Not on file  . Number of children: Not on file  . Years of education: Not on file  . Highest education level: Not on file  Occupational History  . Not on file  Tobacco Use  . Smoking status: Current Every Day Smoker    Packs/day: 1.00    Years: 6.00    Pack years: 6.00    Types: Cigarettes  . Smokeless tobacco: Never Used  Vaping Use  . Vaping Use:  Never used  Substance and Sexual Activity  . Alcohol use: Yes    Comment: occ has a beer   . Drug use: Yes    Types: Methamphetamines, Marijuana    Comment: heroin  . Sexual activity: Not Currently  Other Topics Concern  . Not on file  Social History Narrative  . Not on file   Social Determinants of Health   Financial Resource Strain: Not on file  Food Insecurity: Not on file  Transportation Needs: Not on file  Physical Activity: Not on file  Stress: Not on file  Social Connections: Not on file   Additional Social History:    Allergies:   Allergies  Allergen Reactions  . Amoxicillin Hives  . Banana   . Penicillins Hives    Childhood allergy-patient remembers taking Amoxicillin with no side effects. Did it involve swelling of the face/tongue/throat, SOB, or low BP? No Did it involve sudden or severe rash/hives, skin peeling, or any reaction on the inside of your mouth or nose? No Did you need to seek medical attention at a hospital or doctor's office? Unknown When did it last happen?childhood If all above answers are "NO", may proceed with cephalosporin use.   . Sulfa Antibiotics Nausea And Vomiting    Labs: No results found for this or any previous visit (from the past 48 hour(s)).  Medications:  Current Facility-Administered Medications  Medication Dose Route Frequency Provider Last Rate Last Admin  . acetaminophen (TYLENOL) tablet 650 mg  650 mg Oral Q4H PRN Arby Barrette, MD   650 mg at 11/19/20 1118  . carbamazepine (TEGRETOL XR) 12 hr tablet 200 mg  200 mg Oral BID Hillery Jacks N, NP      . ziprasidone (GEODON) injection 20 mg  20 mg Intramuscular Once Oneta Rack, NP       And  . LORazepam (ATIVAN) tablet 1 mg  1 mg Oral Once PRN Oneta Rack, NP      . nicotine (NICODERM CQ - dosed in mg/24 hours) patch 14 mg  14 mg Transdermal Once Milagros Loll, MD   14 mg at 11/19/20 1426  . OLANZapine zydis (ZYPREXA) disintegrating tablet 5 mg  5 mg  Oral BID Oneta Rack, NP       Current Outpatient Medications  Medication Sig Dispense Refill  . fluPHENAZine (PROLIXIN) 10 MG tablet Take 1 tablet (10 mg total) by mouth at bedtime. (Patient not taking: No sig reported) 30 tablet 0  . hydrOXYzine (ATARAX/VISTARIL) 25 MG tablet Take 1 tablet (25 mg total) by mouth 3 (three) times daily as needed for anxiety. (Patient not taking: Reported on 11/20/2020) 30 tablet 0  . nicotine polacrilex (NICORETTE) 2 MG gum Take 1 each (2 mg total) by mouth as needed for smoking cessation. (Patient not taking: Reported on 11/20/2020) 100 tablet 0  . Oxcarbazepine (TRILEPTAL) 300 MG tablet  Take 1 tablet (300 mg total) by mouth at bedtime. (Patient not taking: Reported on 11/20/2020) 30 tablet 0    Musculoskeletal: Strength & Muscle Tone: within normal limits Gait & Station: normal Patient leans: N/A  Psychiatric Specialty Exam: Physical Exam Vitals reviewed.  Neurological:     Mental Status: She is alert.  Psychiatric:        Mood and Affect: Mood normal.        Thought Content: Thought content normal.     Review of Systems  Psychiatric/Behavioral: Positive for agitation and behavioral problems. The patient is nervous/anxious.     Blood pressure 114/68, pulse 89, temperature 98.5 F (36.9 C), temperature source Oral, resp. rate 18, SpO2 98 %.There is no height or weight on file to calculate BMI.  General Appearance: Disheveled  Eye Contact:  Fair  Speech:  Clear and Coherent  Volume:  Normal  Mood:  Angry, Anxious, Depressed and Irritable  Affect:  Labile  Thought Process:  Linear  Orientation:  Full (Time, Place, and Person)  Thought Content:  Rumination  Suicidal Thoughts:  unable to assess patient refused to answer questions  Homicidal Thoughts:    Memory:  Immediate;   Fair Recent;   Fair  Judgement:  Impaired  Insight:  Lacking  Psychomotor Activity:  Normal  Concentration:  Concentration: Fair  Recall:  [employer] of Knowledge:   Fair  Language:  Fair  Akathisia:  No  Handed:  Right  AIMS (if indicated):     Assets:  Communication Skills Desire for Improvement Social Support Talents/Skills  ADL's:  Intact  Cognition:  WNL  Sleep:        Treatment Plan Summary: Daily contact with patient to assess and evaluate symptoms and progress in treatment, Medication management and Plan Initiated Zyprexa p.o. twice daily and Trileptal 200 mg p.o. twice daily  -See chart for agitation protocol  Disposition: Recommend psychiatric Inpatient admission when medically cleared.  This service was provided via telemedicine using a 2-way, interactive audio and video technology.  Names of all persons participating in this telemedicine service and their role in this encounter. Name: Shenetta Schnackenberg Role: Patient   Name: T. Jomari Bartnik Role: NP           Oneta Rack, NP 11/20/2020 10:23 AM

## 2020-11-20 NOTE — ED Notes (Signed)
Refused EKG

## 2020-11-20 NOTE — ED Notes (Signed)
Pt came out of her room to fuss at this writer because she wants "Zyprexa" only and not another antipsychotic.

## 2020-11-20 NOTE — ED Provider Notes (Signed)
Emergency Medicine Observation Re-evaluation Note  Carol Miles is a 30 y.o. female, seen on rounds today.  Pt initially presented to the ED for complaints of Psychiatric Evaluation   Physical Exam  BP 114/68 (BP Location: Left Arm)   Pulse 89   Temp 98.5 F (36.9 C) (Oral)   Resp 18   SpO2 98%  Physical Exam General: calm  Cardiac: well perfused Lungs: Even, unlabored respirations Psych: Calm  ED Course / MDM  EKG:EKG Interpretation  Date/Time:  Tuesday November 18 2020 08:48:21 EST Ventricular Rate:  104 PR Interval:    QRS Duration: 80 QT Interval:  340 QTC Calculation: 448 R Axis:   80 Text Interpretation: Sinus tachycardia Borderline T wave abnormalities 1no sig change from previous Confirmed by Arby Barrette [phone]) on 11/18/2020 9:54:53 AM    I have reviewed the labs performed to date as well as medications administered while in observation.  Recent changes in the last 24 hours include patient needing inpatient psych placement.  Plan  Current plan is for inpatient psych placement. Patient is under full IVC at this time.   Maia Plan, MD 11/21/20 [phone]

## 2020-11-20 NOTE — Progress Notes (Signed)
Chaplain rounding on unit for follow up.  Pt resting with mother visiting at bedside.  Chaplain will follow with pt tomorrow AM.

## 2020-11-20 NOTE — Progress Notes (Signed)
Chaplain providing support with Carol Miles at bedside.  This chaplain familiar with pt from admission at [facility].     Carol Miles presents as calm and appropriate in conversation.  Is aware of VH / AH.  States that she saw demons when she closed her eyes and felt there was a presence under the bed.  She notes that she has had these delusions before and that the medication she received is helping.     She speaks with chaplain about recent death of her brother.  Carol Miles grew up [employer], but identifies as non-denominational.  Reports her brother's funeral is Saturday, and she was hopeful to do confession and communion at funeral.   Realizes that she may be in hospital and is open to possibility of working with chaplain to do communion at [facility] or having priest come to hospital.    Chaplain provided grief support around not only her brother's death, but the death of Carol Miles's father and an uncle a few years ago.  Carol Miles is open to continued support.  Will follow up during course of treatment for grief support and ritual as appropriate.

## 2020-11-20 NOTE — ED Notes (Signed)
Mother visiting at bedside.  

## 2020-11-21 DIAGNOSIS — F1994 Other psychoactive substance use, unspecified with psychoactive substance-induced mood disorder: Secondary | ICD-10-CM

## 2020-11-21 MED ORDER — OLANZAPINE 5 MG PO TBDP: 5.0000 mg | ORAL_TABLET | Freq: Two times a day (BID) | ORAL | 0 refills | Status: DC

## 2020-11-21 MED ORDER — NICOTINE 21 MG/24HR TD PT24
21.0000 mg | MEDICATED_PATCH | Freq: Every day | TRANSDERMAL | Status: DC
  Administered 2020-11-21: 21 mg via TRANSDERMAL
  Filled 2020-11-21: qty 1

## 2020-11-21 NOTE — ED Notes (Signed)
Pt agitated. Yelling and threatening. Pt states is not manic. Pt wants to go to funeral

## 2020-11-21 NOTE — BH Assessment (Signed)
[facility] Assessment Progress Note   Per Maxie Barb, NP, this pt does not require psychiatric hospitalization at this time.  Pt presents under IVC initiated by EDP Cristy Friedlander, MD which has been rescinded by EDP Meridee Score, MD.  Pt is psychiatrically cleared.  Discharge instructions include referral information for substance abuse treatment programs in [place] and [place].  Dr Rayfield Citizen and pt's nurse, Waynetta Sandy, have been notified.  Doylene Canning, MA Triage Specialist [phone]
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
## 2020-11-21 NOTE — ED Notes (Signed)
Pt DCd off unit to home per provider. Pt alert, calm, cooperative, no s/s of distress. DC information given to and reviewed with pt, pt acknowledged understanding. Belongings given to pt. Pt ambulatory off unit, escorted by RN. Pt transported by family.

## 2020-11-21 NOTE — ED Provider Notes (Signed)
Emergency Medicine Observation Re-evaluation Note  Carol Miles is a 30 y.o. female, seen on rounds today.  Pt initially presented to the ED for complaints of Psychiatric Evaluation Currently, the patient is calm.  Physical Exam  BP (!) 110/57 (BP Location: Right Arm)   Pulse 74   Temp 98.1 F (36.7 C) (Oral)   Resp 19   SpO2 98%  Physical Exam General: No acute distress Cardiac: Well perfused Lungs: Even unlabored respirations Psych: Cooperative  ED Course / MDM  EKG:EKG Interpretation  Date/Time:  Tuesday November 18 2020 08:48:21 EST Ventricular Rate:  104 PR Interval:    QRS Duration: 80 QT Interval:  340 QTC Calculation: 448 R Axis:   80 Text Interpretation: Sinus tachycardia Borderline T wave abnormalities 1no sig change from previous Confirmed by Arby Barrette [phone]) on 11/18/2020 9:54:53 AM    I have reviewed the labs performed to date as well as medications administered while in observation.  Recent changes in the last 24 hours include had an outburst last night but is currently calm.  Plan  Current plan is for inpatient psychiatric bed. Patient is under full IVC at this time.   Terrilee Files, MD 11/21/20 [phone]

## 2020-11-21 NOTE — Discharge Instructions (Addendum)
To help you maintain a sober lifestyle, a substance abuse treatment program may be beneficial to you.  Contact one of the following providers at your earliest opportunity to ask about enrolling their program:  FOR [place] RESIDENTS:       [facility]      [address]      [place], [place] [address]      [phone]      Ask about their Substance Abuse Intensive Outpatient Program.  They also offer psychiatry/medication management and therapy.  New patients are being seen in their walk-in clinic.  Walk-in hours are Monday - Thursday from 8:00 am - 11:00 am for psychiatry, and Friday from 1:00 pm - 4:00 pm for therapy.  Walk-in patients are seen on a first come, first served basis, so try to arrive as early as possible for the best chance of being seen the same day.  FOR [place] RESIDENTS:       [facility] [place]      [address]      [place], [place] [address]      [phone]

## 2020-12-01 ENCOUNTER — Ambulatory Visit (HOSPITAL_COMMUNITY): Payer: No Payment, Other | Admitting: Behavioral Health

## 2020-12-01 ENCOUNTER — Other Ambulatory Visit: Payer: Self-pay

## 2020-12-03 ENCOUNTER — Encounter (HOSPITAL_COMMUNITY): Payer: Self-pay

## 2020-12-03 ENCOUNTER — Ambulatory Visit (INDEPENDENT_AMBULATORY_CARE_PROVIDER_SITE_OTHER): Payer: No Payment, Other | Admitting: Behavioral Health

## 2020-12-03 ENCOUNTER — Other Ambulatory Visit: Payer: Self-pay

## 2020-12-03 DIAGNOSIS — F1520 Other stimulant dependence, uncomplicated: Secondary | ICD-10-CM

## 2020-12-04 ENCOUNTER — Encounter (HOSPITAL_COMMUNITY): Payer: Self-pay | Admitting: Behavioral Health

## 2020-12-04 NOTE — Group Note (Signed)
Group Topic: Feelings and Emotions  Group Date: 12/03/2020 Start Time:  9:00 AM End Time: 12:00 PM Facilitators: Mamie Nick, Counselor  Department: [facility]  Number of Participants: 3  Group Focus: acceptance, anger management, anxiety, co-dependency, coping skills, dual diagnosis, family, relapse prevention, and self-awareness Treatment Modality:  Cognitive Behavioral Therapy Interventions utilized were clarification, confrontation, exploration, and group exercise Purpose: enhance coping skills, explore maladaptive thinking, express feelings, increase insight, relapse prevention strategies, and trigger / craving management   Name: Carol Miles Date of Birth: 1991-02-04  MR: [id number]    Level of Participation: withdrawn Quality of Participation: oppositional and uncooperative Interactions with others: gave feedback Mood/Affect: agitated and flat Triggers (if applicable): N/A Cognition: coherent/clear Progress: None Response: Today is clt's first day attending SAIOP group at [facility]. Clt was attentive to other group members as others shared their check-in. Clt checked-in by sharing that she is currently grieving the death of her brother. She also shared that she is currently living in the same residence where her brother committed suicide. After the first group break, clt was given an assignment of "Writing a Break-Up Letter to Your [facility]". Clt stated she did not like this assignment and felt it was childish. She stated she does not see her addiction as a "relationship" and would rather not participate in the group exercise/assignment. Group leader asked clt if she felt she would like to be dismissed early from group today and she reported "yes". Clt proceeded to leave the group room. After approx 30 minutes later, clt then returned inside the building, accompanied by her mother (ROI signed), and asked the front desk staff to speak with the group leader.  Client and her mother spoke directly with this Clinical research associate and the IOP Physician Assistant - Maryjean Morn to discuss concerns/follow-up plan. Client then stated "I'm sorry I didn't want to do your childish assignment". After clt was informed that individual therapy would benefit her best at this time, given her repsponse to group therapy treatment  - she became agitated and abruptly left the room. Clt's mother remained in the room and attempted to gain an understanding of the "next plan".   Plan: referral / recommendations - Individual Therapy   Patients Problems:  Patient Active Problem List   Diagnosis Date Noted  . Substance induced mood disorder (HCC) 11/21/2020  . Depression   . Acute psychosis (HCC) 07/20/2020  . Bipolar I disorder, most recent episode mixed (HCC) 06/18/2020  . Affective psychosis, bipolar (HCC) 06/10/2020  . Amphetamine use disorder, severe (HCC)   . Bipolar disorder (HCC) 05/09/2020  . History of bipolar disorder 05/18/2019  . Bipolar disorder, current episode mixed, moderate (HCC) 02/27/2019  . Moderate benzodiazepine use disorder (HCC) 02/27/2019  . Polysubstance abuse (HCC) 02/27/2019  . Tobacco use disorder 02/27/2019  . Methamphetamine use disorder, severe (HCC) 02/25/2019  . Bipolar disorder, most recent episode hypomanic (HCC) 09/02/2018  . Severe cannabis use disorder (HCC) 09/02/2018     Family Program: Family present? Yes   Name of family member(s): Stephannie Peters (ROI signed)  UDS collected: No Results: N/A  AA/NA attended?: No  Sponsor?: No
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
## 2020-12-05 ENCOUNTER — Ambulatory Visit (HOSPITAL_COMMUNITY): Payer: Federal, State, Local not specified - Other

## 2020-12-08 ENCOUNTER — Ambulatory Visit (HOSPITAL_COMMUNITY): Payer: Self-pay

## 2020-12-10 ENCOUNTER — Ambulatory Visit (HOSPITAL_COMMUNITY): Payer: Self-pay

## 2020-12-11 ENCOUNTER — Telehealth (HOSPITAL_COMMUNITY): Payer: Self-pay | Admitting: Behavioral Health

## 2020-12-12 ENCOUNTER — Ambulatory Visit (HOSPITAL_COMMUNITY): Payer: Self-pay

## 2020-12-12 NOTE — Progress Notes (Signed)
Carol Miles is a 30 year old female who presents to [facility] outpatient for a scheduled SAIOP intake appointment. Client reports crystal meth and marijuana to be her drugs of choice. Client reports "3 - 4 weeks ago" as her date of last use (Route of use: snort). Client has agreed to attend SAIOP group therapy on Mondays, Wednesdays, and Fridays from 9:00am - 12:00pm. Group rules and group expectations were discussed during today's session.  Triggers: Grief/loss - Clt reports her brother recently committed suicide.   SAIOP Start Date: 12/03/2020   Mamie Nick, Counselor

## 2020-12-15 ENCOUNTER — Ambulatory Visit (HOSPITAL_COMMUNITY): Payer: Self-pay

## 2020-12-15 ENCOUNTER — Ambulatory Visit (HOSPITAL_COMMUNITY): Payer: Federal, State, Local not specified - Other | Admitting: Behavioral Health

## 2020-12-17 ENCOUNTER — Ambulatory Visit (HOSPITAL_COMMUNITY): Payer: Self-pay

## 2020-12-19 ENCOUNTER — Ambulatory Visit (HOSPITAL_COMMUNITY): Payer: Self-pay

## 2020-12-22 ENCOUNTER — Ambulatory Visit (HOSPITAL_COMMUNITY): Payer: Self-pay

## 2020-12-24 ENCOUNTER — Ambulatory Visit (HOSPITAL_COMMUNITY): Payer: Self-pay

## 2020-12-26 ENCOUNTER — Ambulatory Visit (HOSPITAL_COMMUNITY): Payer: Self-pay

## 2020-12-29 ENCOUNTER — Ambulatory Visit (HOSPITAL_COMMUNITY): Payer: Self-pay

## 2020-12-31 ENCOUNTER — Ambulatory Visit (HOSPITAL_COMMUNITY): Payer: Self-pay

## 2021-01-02 ENCOUNTER — Ambulatory Visit (HOSPITAL_COMMUNITY): Payer: Self-pay

## 2021-01-05 ENCOUNTER — Ambulatory Visit (HOSPITAL_COMMUNITY): Payer: Self-pay

## 2021-01-07 ENCOUNTER — Ambulatory Visit (HOSPITAL_COMMUNITY): Payer: Self-pay

## 2021-01-09 ENCOUNTER — Ambulatory Visit (HOSPITAL_COMMUNITY): Payer: Self-pay

## 2021-01-12 ENCOUNTER — Ambulatory Visit (HOSPITAL_COMMUNITY): Payer: Self-pay

## 2021-01-14 ENCOUNTER — Ambulatory Visit (HOSPITAL_COMMUNITY): Payer: Self-pay

## 2021-04-01 IMAGING — CT CT ABDOMEN AND PELVIS WITH CONTRAST
2 of 4 series · 16 of 46 positions shown, 18 images · IV contrast (Isovue)
Comparison: None.

CLINICAL DATA: 27-year-old female with history of suprapubic
abdominal pain and vaginal pain after sex yesterday evening.

EXAM:
CT ABDOMEN AND PELVIS WITH CONTRAST
TECHNIQUE: Multidetector CT imaging of the abdomen and pelvis was performed
using the standard protocol following bolus administration of
intravenous contrast.
CONTRAST:  100mL OMNIPAQUE IOHEXOL 300 MG/ML  SOLN

[Series 2: axial st · axial · 0.69mm/px · z∈[+718,+1128]mm · 13 of 92 slices shown, 15 images]
[im 5/92  soft-tissue]
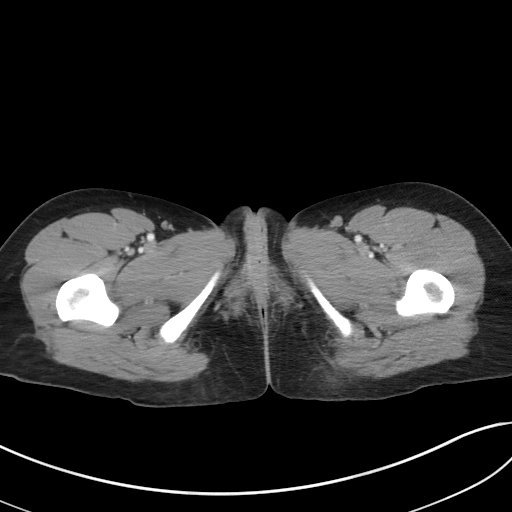
[im 5/92  bone]
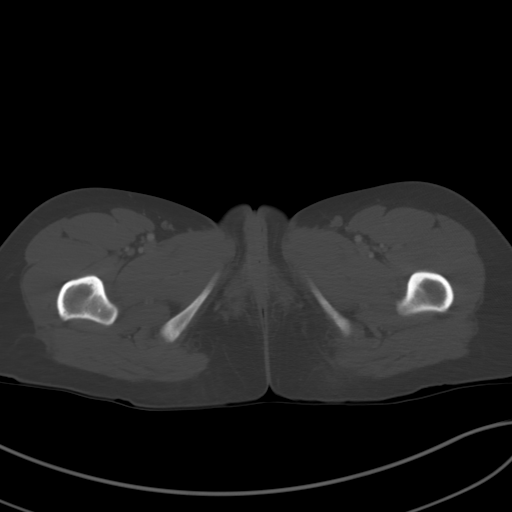
[im 14/92  soft-tissue]
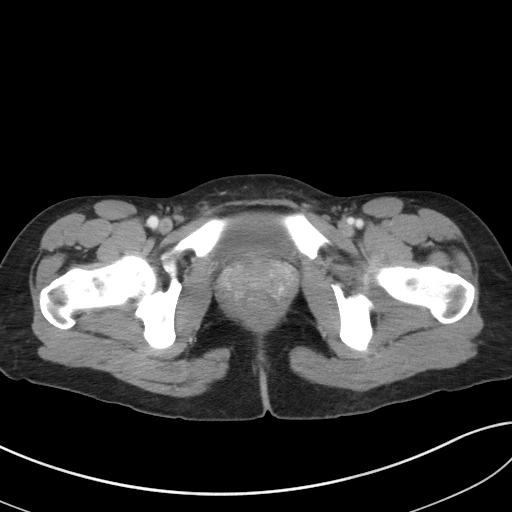
[im 19/92  soft-tissue]
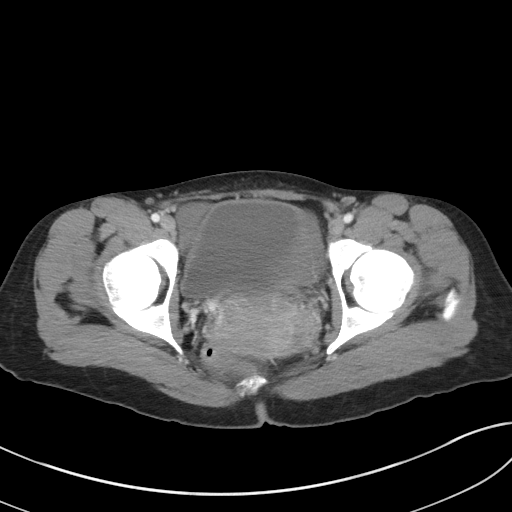
[im 28/92  soft-tissue]
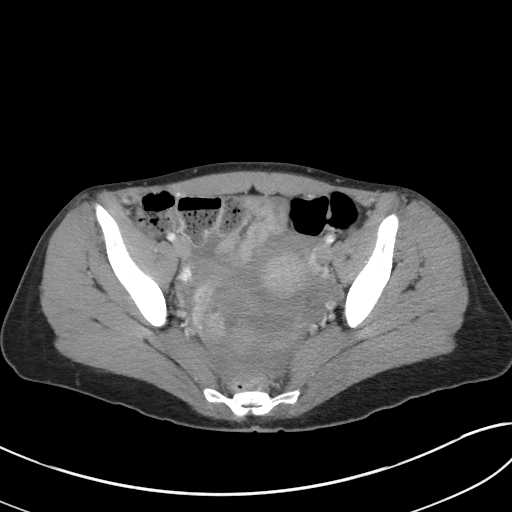
[im 32/92  soft-tissue]
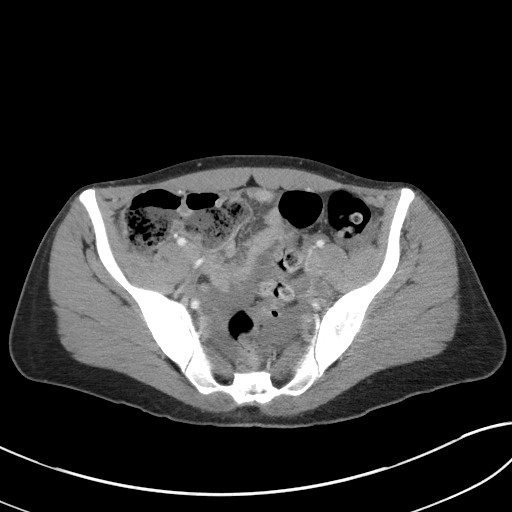
[im 41/92  soft-tissue]
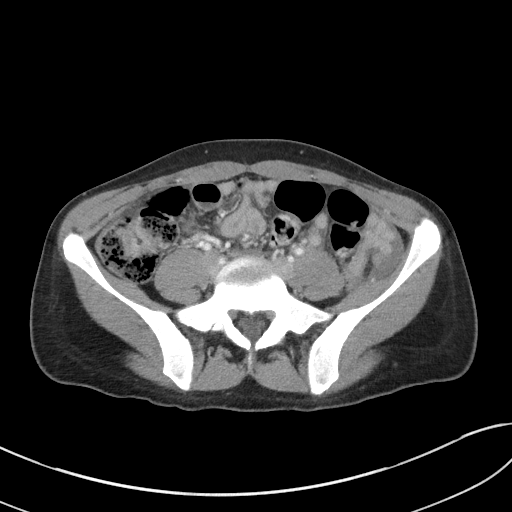
[im 46/92  soft-tissue]
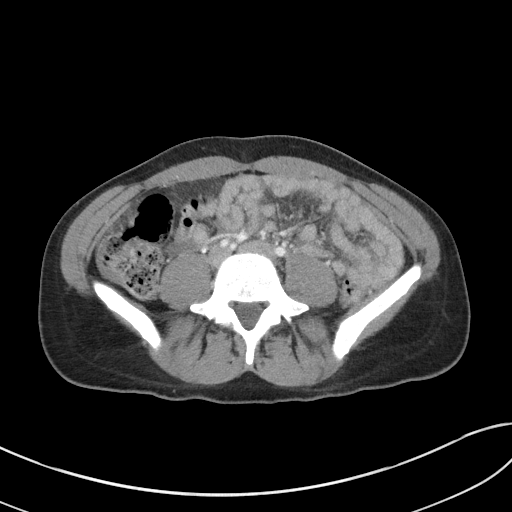
[im 51/92  soft-tissue]
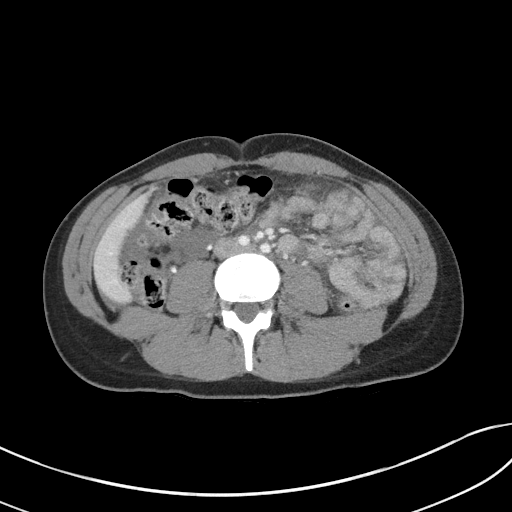
[im 60/92  soft-tissue]
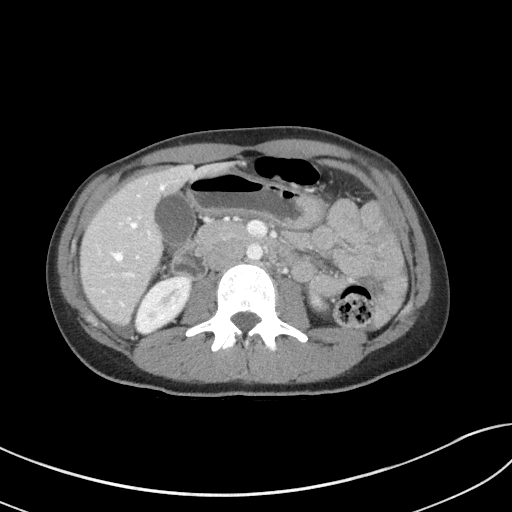
[im 60/92  bone]
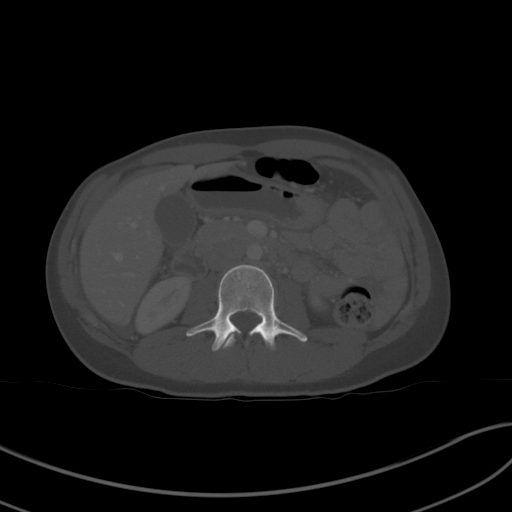
[im 64/92  soft-tissue]
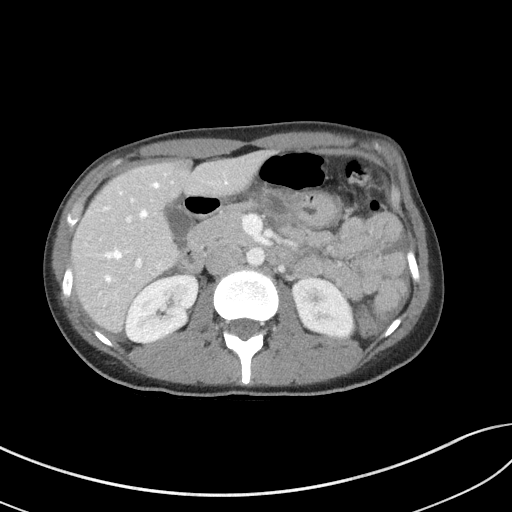
[im 73/92  soft-tissue]
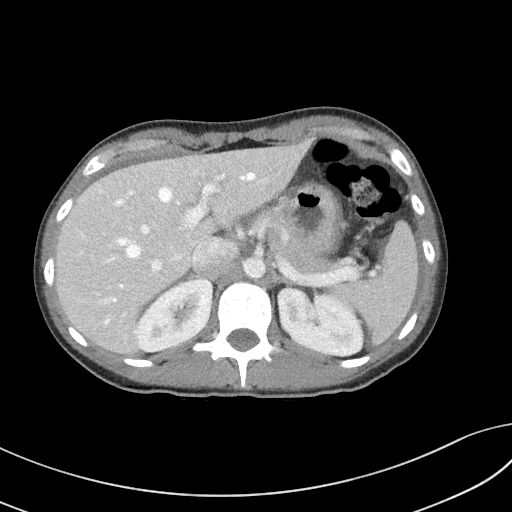
[im 78/92  soft-tissue]
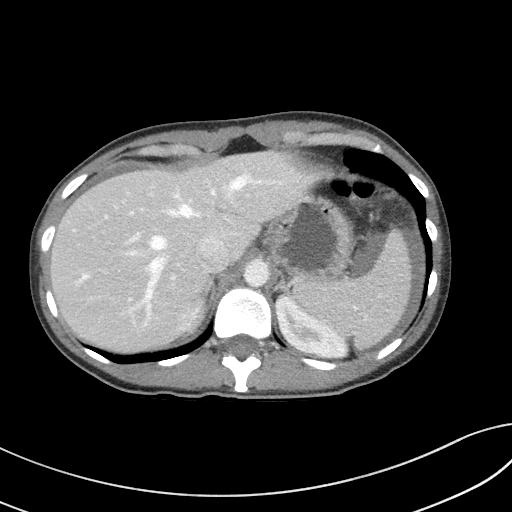
[im 87/92  soft-tissue]
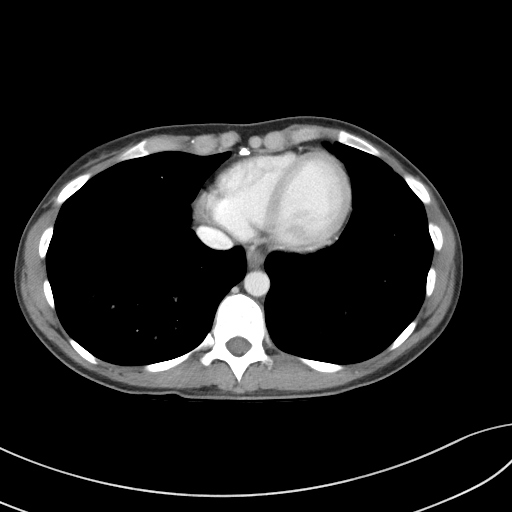

[Series 5: coronal st · coronal · 0.71mm/px · 3 of 71 slices shown]
[im 24/71  soft-tissue]
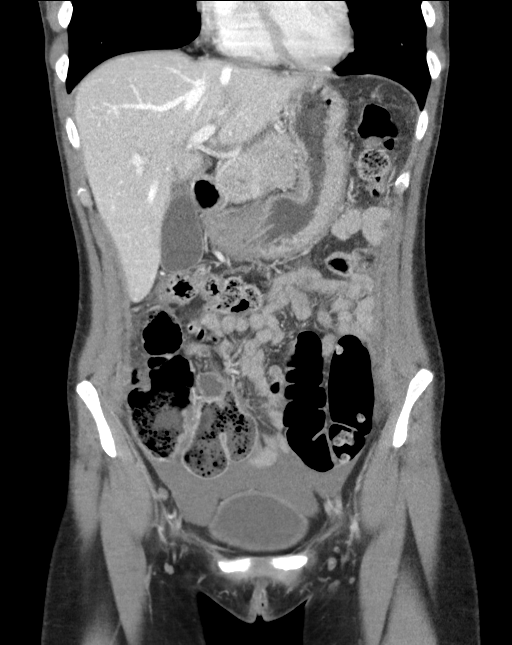
[im 32/71  soft-tissue]
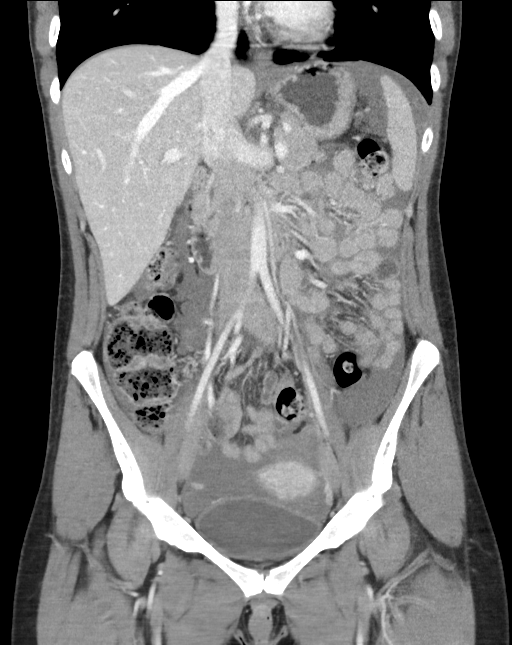
[im 39/71  soft-tissue]
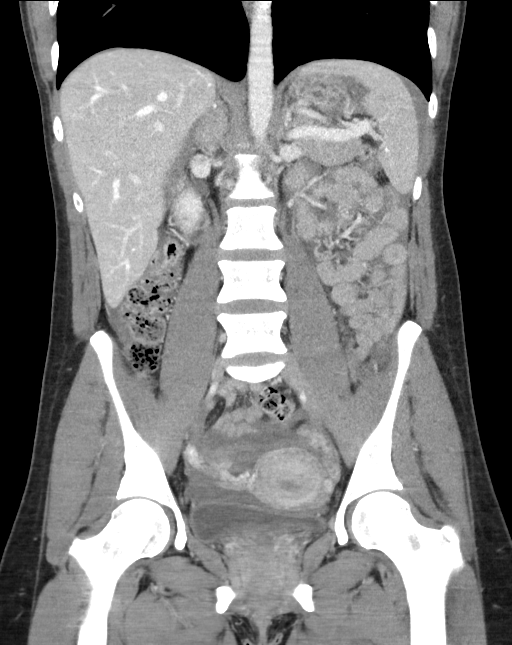

[16 of 46 positions shown; findings below may reference images not displayed]

FINDINGS: Lower chest: Unremarkable.

Hepatobiliary: No suspicious cystic or solid hepatic lesions. No
intra or extrahepatic biliary ductal dilatation. Gallbladder is
unremarkable in appearance.

Pancreas: No pancreatic mass. No pancreatic ductal dilatation. No
pancreatic or peripancreatic fluid collections or inflammatory
changes.

Spleen: Unremarkable.

Adrenals/Urinary Tract: Bilateral kidneys and adrenal glands are
normal in appearance. No hydroureteronephrosis. Urinary bladder is
normal in appearance.

Stomach/Bowel: Normal appearance of the stomach. No pathologic
dilatation of small bowel or colon. Normal appendix.

Vascular/Lymphatic: No significant atherosclerotic disease, aneurysm
or dissection noted in the abdominal or pelvic vasculature. No
lymphadenopathy noted in the abdomen or pelvis.

Reproductive: Uterus and left ovary are unremarkable in appearance.
In the right ovary there is a centrally low-attenuation rim
enhancing lesion measuring 2.4 x 3.2 x 2.2 cm (axial image 67 of
series 2 and coronal image 51 of series 5), likely to represent a
degenerating corpus luteum cyst or recently ruptured cyst.

Other: Small volume of free fluid throughout the peritoneal cavity,
extending as high as the liver and spleen. In the pelvis this is
high attenuation, indicative of blood products. No pneumoperitoneum.

Musculoskeletal: There are no aggressive appearing lytic or blastic
lesions noted in the visualized portions of the skeleton.
IMPRESSION: 1. Small amount of fluid throughout the peritoneal cavity, most
evident in the low anatomic pelvis where this is high attenuation
indicative of blood products. The origin of this is likely from the
right adnexa where there appears to be a recently ruptured cyst, as
discussed above.

## 2021-04-10 ENCOUNTER — Other Ambulatory Visit: Payer: Self-pay

## 2021-04-10 ENCOUNTER — Ambulatory Visit (INDEPENDENT_AMBULATORY_CARE_PROVIDER_SITE_OTHER): Payer: Self-pay | Admitting: Osteopathic Medicine

## 2021-04-10 ENCOUNTER — Ambulatory Visit (INDEPENDENT_AMBULATORY_CARE_PROVIDER_SITE_OTHER): Payer: Self-pay

## 2021-04-10 ENCOUNTER — Encounter: Payer: Self-pay | Admitting: Osteopathic Medicine

## 2021-04-10 VITALS — BP 102/68 | HR 85 | Ht 67.0 in | Wt 187.0 lb

## 2021-04-10 DIAGNOSIS — M79672 Pain in left foot: Secondary | ICD-10-CM

## 2021-04-10 MED ORDER — NAPROXEN 500 MG PO TABS: 500.0000 mg | ORAL_TABLET | Freq: Two times a day (BID) | ORAL | 1 refills | Status: DC

## 2021-04-10 NOTE — Patient Instructions (Addendum)
Xray today Rx sent for anti-inflammatory to take twice daily for 5-7 days  Advise use rigid post-operative shoe for the next week, can continue for another week if needed  Rest the foot/leg, keep it elevated, apply ice/cold pack to area of pain, can compress with ACE bandage wrap If not better after 2 weeks, or if worse/change in the meantime, will refer to sports medicine to discuss next steps in treatment

## 2021-04-10 NOTE — Progress Notes (Signed)
Carol Miles is a 30 y.o. female who presents to  [facility]  today, 04/10/21, seeking care for the following:  L foot pain ongoing about 9 days, dorsum of L foot, around 2nd/3rd MTP joints. No plantar pain or heel pain, normal ROM ankle and neg drawer sign, pain w/ resisted dorsiflexion of toes but not w/ dorsiflexion of ankle.      ASSESSMENT & PLAN with other pertinent findings:  The encounter diagnosis was Left foot pain.    Patient Instructions  Xray today Rx sent for anti-inflammatory to take twice daily for 5-7 days  Advise use rigid post-operative shoe for the next week, can continue for another week if needed  Rest the foot/leg, keep it elevated, apply ice/cold pack to area of pain, can compress with ACE bandage wrap If not better after 2 weeks, or if worse/change in the meantime, will refer to sports medicine to discuss next steps in treatment   Orders Placed This Encounter  Procedures   DG Foot Complete Left    Meds ordered this encounter  Medications   naproxen (NAPROSYN) 500 MG tablet    Sig: Take 1 tablet (500 mg total) by mouth 2 (two) times daily with a meal. Take every day for one week, then use as needed after that    Dispense:  60 tablet    Refill:  1      See below for relevant physical exam findings  See below for recent lab and imaging results reviewed  Medications, allergies, PMH, PSH, SocH, FamH reviewed below    Follow-up instructions: Return for RECHECK PENDING XRAY RESULTS / IF WORSE OR CHANGE.                                        Exam:  BP 102/68   Pulse 85   Ht 5\' 7"  (1.702 m)   Wt 187 lb (84.8 kg)   SpO2 99%   BMI 29.29 kg/m  Constitutional: VS see above. General Appearance: alert, well-developed, well-nourished, NAD Neck: No masses, trachea midline.  Respiratory: Normal respiratory effort.  Musculoskeletal: see above for L ankle  exam  Neurological: Normal balance/coordination. No tremor. Skin: warm, dry, intact.  Psychiatric: Normal judgment/insight. Normal mood and affect. Oriented x3.   Current Meds  Medication Sig   cariprazine (VRAYLAR) 3 MG capsule Take by mouth.   naproxen (NAPROSYN) 500 MG tablet Take 1 tablet (500 mg total) by mouth 2 (two) times daily with a meal. Take every day for one week, then use as needed after that   [DISCONTINUED] OLANZapine zydis (ZYPREXA) 5 MG disintegrating tablet Take 1 tablet (5 mg total) by mouth in the morning and at bedtime.    Allergies  Allergen Reactions   Amoxicillin Hives   Banana    Penicillins Hives    Childhood allergy-patient remembers taking Amoxicillin with no side effects. Did it involve swelling of the face/tongue/throat, SOB, or low BP? No Did it involve sudden or severe rash/hives, skin peeling, or any reaction on the inside of your mouth or nose? No Did you need to seek medical attention at a hospital or doctor's office? Unknown When did it last happen?      childhood If all above answers are "NO", may proceed with cephalosporin use.    Sulfa Antibiotics Nausea And Vomiting    Patient Active Problem  List   Diagnosis Date Noted   Substance induced mood disorder (HCC) 11/21/2020   Depression    Acute psychosis (HCC) 07/20/2020   Bipolar I disorder, most recent episode mixed (HCC) 06/18/2020   Affective psychosis, bipolar (HCC) 06/10/2020   Amphetamine use disorder, severe (HCC)    Bipolar disorder (HCC) 05/09/2020   History of bipolar disorder 05/18/2019   Bipolar disorder, current episode mixed, moderate (HCC) 02/27/2019   Moderate benzodiazepine use disorder (HCC) 02/27/2019   Polysubstance abuse (HCC) 02/27/2019   Tobacco use disorder 02/27/2019   Methamphetamine use disorder, severe (HCC) 02/25/2019   Bipolar disorder, most recent episode hypomanic (HCC) 09/02/2018   Severe cannabis use disorder (HCC) 09/02/2018    Family History   Problem Relation Age of Onset   Hypertension Mother    Hypertension Father    Skin cancer Father    OCD Brother    Schizophrenia Maternal Grandmother    Ovarian cancer Maternal Aunt     Social History   Tobacco Use  Smoking Status Every Day   Packs/day: 1.00   Years: 15.00   Pack years: 15.00   Types: Cigarettes  Smokeless Tobacco Never    Past Surgical History:  Procedure Laterality Date   HERNIA REPAIR       There is no immunization history on file for this patient.  No results found for this or any previous visit (from the past 2160 hour(s)).  No results found.     All questions at time of visit were answered - patient instructed to contact office with any additional concerns or updates. ER/RTC precautions were reviewed with the patient as applicable.   Please note: manual typing as well as voice recognition software may have been used to produce this document - typos may escape review. Please contact Dr. Lyn Hollingshead for any needed clarifications.

## 2022-05-30 ENCOUNTER — Ambulatory Visit
Admission: EM | Admit: 2022-05-30 | Discharge: 2022-05-30 | Disposition: A | Discharge status: Home / Self Care | Payer: Self-pay | Attending: Family Medicine | Admitting: Family Medicine

## 2022-05-30 ENCOUNTER — Other Ambulatory Visit: Payer: Self-pay

## 2022-05-30 DIAGNOSIS — L02415 Cutaneous abscess of right lower limb: Secondary | ICD-10-CM | POA: Insufficient documentation

## 2022-05-30 DIAGNOSIS — N3001 Acute cystitis with hematuria: Secondary | ICD-10-CM | POA: Insufficient documentation

## 2022-05-30 DIAGNOSIS — Z792 Long term (current) use of antibiotics: Secondary | ICD-10-CM | POA: Insufficient documentation

## 2022-05-30 LAB — POCT URINALYSIS DIP (MANUAL ENTRY)
Bilirubin, UA: NEGATIVE
Glucose, UA: NEGATIVE mg/dL
Ketones, POC UA: NEGATIVE mg/dL
Nitrite, UA: NEGATIVE
Protein Ur, POC: NEGATIVE mg/dL
Spec Grav, UA: 1.02 (ref 1.010–1.025)
Urobilinogen, UA: 0.2 E.U./dL
pH, UA: 5.5 (ref 5.0–8.0)

## 2022-05-30 MED ORDER — NITROFURANTOIN MONOHYD MACRO 100 MG PO CAPS: 100.0000 mg | ORAL_CAPSULE | Freq: Two times a day (BID) | ORAL | 0 refills | Status: AC

## 2022-05-30 NOTE — ED Triage Notes (Signed)
Pt states that she has some blood in her urine and pain with urination. X4-5 days  Pt states that she also has an abscess on her right thigh. X1 week.

## 2022-05-30 NOTE — Discharge Instructions (Addendum)
Advised patient to take medication as directed with food to completion.  Encourage patient increase daily water intake while taking this medication.  Advised we will follow-up with urine culture results once received.  Advised patient if symptoms worsen and/or unresolved please follow-up with PCP or here for further evaluation.

## 2022-05-30 NOTE — ED Provider Notes (Signed)
[facility]    CSN: [id number] Arrival date & time: 05/30/22  0810      History   Chief Complaint Chief Complaint  Patient presents with   Hematuria    Blood in urine and pain with urination. X4-5 days   Abscess    Abscess on right thigh. X1 week    HPI Carol Miles is a 31 y.o. female.   HPI 31 year old female presents with hematuria and dysuria for 4 to 5 days, additionally reports has abscess on her right thigh for 1 week.  PMH significant for substance-induced mood disorder, affective psychosis, bipolar disorder, and methamphetamine use disorder, severe.  Past Medical History:  Diagnosis Date   Bipolar 1 disorder (HCC)    Depression    HPV (human papilloma virus) anogenital infection    PTSD (post-traumatic stress disorder)     Patient Active Problem List   Diagnosis Date Noted   Substance induced mood disorder (HCC) 11/21/2020   Depression    Acute psychosis (HCC) 07/20/2020   Bipolar I disorder, most recent episode mixed (HCC) 06/18/2020   Affective psychosis, bipolar (HCC) 06/10/2020   Amphetamine use disorder, severe (HCC)    Bipolar disorder (HCC) 05/09/2020   History of bipolar disorder 05/18/2019   Bipolar disorder, current episode mixed, moderate (HCC) 02/27/2019   Moderate benzodiazepine use disorder (HCC) 02/27/2019   Polysubstance abuse (HCC) 02/27/2019   Tobacco use disorder 02/27/2019   Methamphetamine use disorder, 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      Home Medications    Prior to Admission medications   Medication Sig Start Date End Date Taking? Authorizing Provider  cariprazine (VRAYLAR) 3 MG capsule Take by mouth.   Yes [provider]  FLUoxetine (PROZAC) 20 MG tablet Take 20 mg by  mouth daily.   Yes [provider]  hydrOXYzine (ATARAX) 25 MG tablet Take 25 mg by mouth 3 (three) times daily as needed.   Yes [provider]  nitrofurantoin, macrocrystal-monohydrate, (MACROBID) 100 MG capsule Take 1 capsule (100 mg total) by mouth 2 (two) times daily for 7 days. 05/30/22 06/06/22 Yes Trevor Iha, FNP  prazosin (MINIPRESS) 1 MG capsule Take 1 mg by mouth at bedtime.   Yes [provider]  naproxen (NAPROSYN) 500 MG tablet Take 1 tablet (500 mg total) by mouth 2 (two) times daily with a meal. Take every day for one week, then use as needed after that 04/10/21   Sunnie Nielsen, DO    Family History Family History  Problem Relation Age of Onset   Hypertension Mother    Hypertension Father    Skin cancer Father    OCD Brother    Schizophrenia Maternal Grandmother    Ovarian cancer Maternal Aunt     Social History Social History   Tobacco Use   Smoking status: Every Day    Packs/day: 1.00    Years: 15.00    Total pack years: 15.00    Types: Cigarettes   Smokeless tobacco: Never  Vaping Use   Vaping Use: Every day  Substance Use Topics   Alcohol use: Not Currently   Drug use: Not Currently    Types: Methamphetamines, Marijuana    Comment: heroin     Allergies  Amoxicillin, Banana, Penicillins, and Sulfa antibiotics   Review of Systems Review of Systems  Genitourinary:  Positive for flank pain and hematuria.  Skin:        Possible cutaneous cyst x 7 days  All other systems reviewed and are negative.    Physical Exam Triage Vital Signs ED Triage Vitals  Enc Vitals Group     BP 05/30/22 0824 110/73     Pulse Rate 05/30/22 0824 91     Resp 05/30/22 0824 18     Temp 05/30/22 0824 98.6 F (37 C)     Temp Source 05/30/22 0824 Oral     SpO2 05/30/22 0824 97 %     Weight 05/30/22 0821 215 lb (97.5 kg)     Height 05/30/22 0821 5\' 7"  (1.702 m)     Head Circumference --      Peak Flow --      Pain Score 05/30/22 0821  2     Pain Loc --      Pain Edu? --      Excl. in GC? --    No data found.  Updated Vital Signs BP 110/73 (BP Location: Left Arm)   Pulse 91   Temp 98.6 F (37 C) (Oral)   Resp 18   Ht 5\' 7"  (1.702 m)   Wt 215 lb (97.5 kg)   LMP 05/16/2022   SpO2 97%   BMI 33.67 kg/m    Physical Exam Vitals and nursing note reviewed. Exam conducted with a chaperone present.  Constitutional:      General: She is not in acute distress.    Appearance: Normal appearance. She is obese. She is not ill-appearing.  HENT:     Head: Normocephalic and atraumatic.     Mouth/Throat:     Mouth: Mucous membranes are moist.     Pharynx: Oropharynx is clear.  Eyes:     Extraocular Movements: Extraocular movements intact.     Conjunctiva/sclera: Conjunctivae normal.     Pupils: Pupils are equal, round, and reactive to light.  Cardiovascular:     Rate and Rhythm: Normal rate and regular rhythm.     Pulses: Normal pulses.     Heart sounds: Normal heart sounds. No murmur heard. Pulmonary:     Effort: Pulmonary effort is normal.     Breath sounds: Normal breath sounds. No wheezing, rhonchi or rales.  Abdominal:     Tenderness: There is no right CVA tenderness or left CVA tenderness.  Musculoskeletal:     Cervical back: Normal range of motion and neck supple.  Skin:    General: Skin is warm and dry.     Comments: Right inner thigh (inferior medial aspect): Tiny (<0.5 cm) circular shaped follicular papule, no discharge/drainage/bleeding, non-erythematous, non-indurated  Neurological:     General: No focal deficit present.     Mental Status: She is alert and oriented to person, place, and time. Mental status is at baseline.      UC Treatments / Results  Labs (all labs ordered are listed, but only abnormal results are displayed) Labs Reviewed  POCT URINALYSIS DIP (MANUAL ENTRY) - Abnormal; Notable for the following components:      Result Value   Blood, UA moderate (*)    Leukocytes, UA Small  (1+) (*)    All other components within normal limits  URINE CULTURE    EKG   Radiology No results found.  Procedures Procedures (including critical care time)  Medications Ordered in UC Medications -  No data to display  Initial Impression / Assessment and Plan / UC Course  I have reviewed the triage vital signs and the nursing notes.  Pertinent labs & imaging results that were available during my care of the patient were reviewed by me and considered in my medical decision making (see chart for details).     MDM: 1.  Acute cystitis with hematuria-Rx'd Macrobid. Advised patient to take medication as directed with food to completion.  Encourage patient increase daily water intake while taking this medication.  Advised we will follow-up with urine culture results once received.  Advised patient if symptoms worsen and/or unresolved please follow-up with PCP or here for further evaluation.  Patient discharged home, hemodynamically stable. Final Clinical Impressions(s) / UC Diagnoses   Final diagnoses:  Acute cystitis with hematuria     Discharge Instructions      Advised patient to take medication as directed with food to completion.  Encourage patient increase daily water intake while taking this medication.  Advised we will follow-up with urine culture results once received.  Advised patient if symptoms worsen and/or unresolved please follow-up with PCP or here for further evaluation.     ED Prescriptions     Medication Sig Dispense Auth. Provider   nitrofurantoin, macrocrystal-monohydrate, (MACROBID) 100 MG capsule Take 1 capsule (100 mg total) by mouth 2 (two) times daily for 7 days. 14 capsule Trevor Iha, FNP      PDMP not reviewed this encounter.   Trevor Iha, FNP 05/30/22 [phone]

## 2022-05-31 LAB — URINE CULTURE: Culture: NO GROWTH

## 2022-06-02 ENCOUNTER — Other Ambulatory Visit (HOSPITAL_COMMUNITY)
Admission: RE | Admit: 2022-06-02 | Discharge: 2022-06-02 | Disposition: A | Discharge status: Home / Self Care | Payer: Self-pay | Source: Ambulatory Visit | Attending: Family Medicine | Admitting: Family Medicine

## 2022-06-02 ENCOUNTER — Encounter: Payer: Self-pay | Admitting: Family Medicine

## 2022-06-02 ENCOUNTER — Ambulatory Visit (INDEPENDENT_AMBULATORY_CARE_PROVIDER_SITE_OTHER): Payer: Self-pay | Admitting: Family Medicine

## 2022-06-02 VITALS — BP 107/73 | HR 77 | Ht 67.0 in | Wt 218.0 lb

## 2022-06-02 DIAGNOSIS — Z124 Encounter for screening for malignant neoplasm of cervix: Secondary | ICD-10-CM | POA: Insufficient documentation

## 2022-06-02 DIAGNOSIS — Z113 Encounter for screening for infections with a predominantly sexual mode of transmission: Secondary | ICD-10-CM

## 2022-06-02 DIAGNOSIS — Z0000 Encounter for general adult medical examination without abnormal findings: Secondary | ICD-10-CM

## 2022-06-02 DIAGNOSIS — N9089 Other specified noninflammatory disorders of vulva and perineum: Secondary | ICD-10-CM

## 2022-06-02 DIAGNOSIS — Z13220 Encounter for screening for lipoid disorders: Secondary | ICD-10-CM

## 2022-06-02 NOTE — Progress Notes (Signed)
Subjective:     Patient ID: Carol Miles, female    DOB: [birth year], 31 y.o.   MRN: [id number]  Chief Complaint  Patient presents with   Establish Care    HPI Patient is in today for transfer of care. She is here for wellness today.   Diet: binge eating, eats fruits, veggies, protein Exercise: walks with dog  Pap smear: needs one today. Has bump on lesion Not currently sexually active STD testing would like testing today    Review of Systems  Constitutional:  Negative for chills and fever.  Respiratory:  Negative for cough and shortness of breath.   Cardiovascular:  Negative for chest pain.  Neurological:  Negative for headaches.      Objective:    BP 107/73   Pulse 77   Ht 5\' 7"  (1.702 m)   Wt 218 lb (98.9 kg)   LMP 05/16/2022   SpO2 99%   BMI 34.14 kg/m    Physical Exam Vitals and nursing note reviewed.  Constitutional:      General: She is not in acute distress.    Appearance: Normal appearance.  HENT:     Head: Normocephalic and atraumatic.     Right Ear: External ear normal.     Left Ear: External ear normal.     Nose: Nose normal.  Eyes:     Conjunctiva/sclera: Conjunctivae normal.  Cardiovascular:     Rate and Rhythm: Normal rate and regular rhythm.  Pulmonary:     Effort: Pulmonary effort is normal.     Breath sounds: Normal breath sounds.  Genitourinary:    Comments: Chaperone present during exam. No adnexal mass present. Some discharge noted in vaginal canal. Some erythema/friability of cervix noted. Right labial lesion that is a white dot located.  Neurological:     General: No focal deficit present.     Mental Status: She is alert and oriented to person, place, and time.  Psychiatric:        Mood and Affect: Mood normal.        Behavior: Behavior normal.        Thought Content: Thought content normal.        Judgment: Judgment normal.     No results found for any visits on 06/02/22.      Assessment & Plan:   Problem List  Items Addressed This Visit       Other   Annual physical exam - Primary    - labs ordered: CBC, CMP, lipid, A1C - will get pap today for cervical cancer screening - counseled on diet and exercise      Other Visit Diagnoses     Routine adult health maintenance       Relevant Orders   Hemoglobin A1c   Lipid panel   COMPLETE METABOLIC PANEL WITH GFR   CBC   Screening for lipid disorders       Relevant Orders   Lipid panel   Morbid obesity (HCC)       Relevant Orders   Hemoglobin A1c   Lipid panel   Screen for STD (sexually transmitted disease)       Relevant Orders   HIV antibody (with reflex)   RPR   GC/Chlamydia Probe Amp([facility])   Screening for cervical cancer       Relevant Orders   Cytology - PAP   Labial lesion       Relevant Orders   Herpes simplex virus culture  No orders of the defined types were placed in this encounter.   No follow-ups on file.  Charlton Amor, DO

## 2022-06-02 NOTE — Assessment & Plan Note (Addendum)
-   labs ordered: CBC, CMP, lipid, A1C - will get pap today for cervical cancer screening - counseled on diet and exercise

## 2022-06-03 LAB — COMPLETE METABOLIC PANEL WITH GFR
AG Ratio: 1.6 (calc) (ref 1.0–2.5)
ALT: 13 U/L (ref 6–29)
AST: 12 U/L (ref 10–30)
Albumin: 4.5 g/dL (ref 3.6–5.1)
Alkaline phosphatase (APISO): 106 U/L (ref 31–125)
BUN: 12 mg/dL (ref 7–25)
CO2: 28 mmol/L (ref 20–32)
Calcium: 10 mg/dL (ref 8.6–10.2)
Chloride: 101 mmol/L (ref 98–110)
Creat: 0.95 mg/dL (ref 0.50–0.97)
Globulin: 2.8 g/dL (calc) (ref 1.9–3.7)
Glucose, Bld: 91 mg/dL (ref 65–139)
Potassium: 3.9 mmol/L (ref 3.5–5.3)
Sodium: 139 mmol/L (ref 135–146)
Total Bilirubin: 0.4 mg/dL (ref 0.2–1.2)
Total Protein: 7.3 g/dL (ref 6.1–8.1)
eGFR: 82 mL/min/{1.73_m2} (ref >=60)

## 2022-06-03 LAB — LIPID PANEL
Cholesterol: 219 mg/dL — ABNORMAL HIGH (ref <200)
HDL: 42 mg/dL — ABNORMAL LOW (ref >=50)
Non-HDL Cholesterol (Calc): 177 mg/dL (calc) — ABNORMAL HIGH (ref <130)
Total CHOL/HDL Ratio: 5.2 (calc) — ABNORMAL HIGH (ref <5.0)
Triglycerides: 401 mg/dL — ABNORMAL HIGH (ref <150)

## 2022-06-03 LAB — HEMOGLOBIN A1C
Hgb A1c MFr Bld: 5 % of total Hgb (ref <5.7)
Mean Plasma Glucose: 97 mg/dL
eAG (mmol/L): 5.4 mmol/L

## 2022-06-03 LAB — HIV ANTIBODY (ROUTINE TESTING W REFLEX): HIV 1&2 Ab, 4th Generation: NONREACTIVE

## 2022-06-03 LAB — CBC
HCT: 43.4 % (ref 35.0–45.0)
Hemoglobin: 14.9 g/dL (ref 11.7–15.5)
MCH: 31.2 pg (ref 27.0–33.0)
MCHC: 34.3 g/dL (ref 32.0–36.0)
MCV: 91 fL (ref 80.0–100.0)
MPV: 8.8 fL (ref 7.5–12.5)
Platelets: 301 10*3/uL (ref 140–400)
RBC: 4.77 10*6/uL (ref 3.80–5.10)
RDW: 11.8 % (ref 11.0–15.0)
WBC: 8.8 10*3/uL (ref 3.8–10.8)

## 2022-06-03 LAB — RPR: RPR Ser Ql: NONREACTIVE

## 2022-06-06 LAB — SPECIMEN STATUS REPORT

## 2022-06-06 LAB — GC/CHLAMYDIA PROBE AMP
Chlamydia trachomatis, NAA: NEGATIVE
Neisseria Gonorrhoeae by PCR: NEGATIVE

## 2022-06-08 LAB — CYTOLOGY - PAP
Comment: NEGATIVE
Comment: NEGATIVE
Comment: NEGATIVE
Diagnosis: HIGH — AB
HPV 16: POSITIVE — AB
HPV 18 / 45: NEGATIVE
High risk HPV: POSITIVE — AB

## 2022-06-08 LAB — HERPES SIMPLEX VIRUS CULTURE
MICRO NUMBER:: 13787866
SPECIMEN QUALITY:: ADEQUATE

## 2022-06-09 ENCOUNTER — Other Ambulatory Visit: Payer: Self-pay | Admitting: Family Medicine

## 2022-06-09 DIAGNOSIS — R87613 High grade squamous intraepithelial lesion on cytologic smear of cervix (HGSIL): Secondary | ICD-10-CM

## 2022-06-27 DIAGNOSIS — F312 Bipolar disorder, current episode manic severe with psychotic features: Secondary | ICD-10-CM | POA: Insufficient documentation

## 2023-04-18 IMAGING — DX DG FOOT COMPLETE 3+V*L*
3 series · 3 of 3 positions shown · non-contrast
Comparison: None.

CLINICAL DATA: 29-year-old female with left hip pain.

EXAM:
LEFT FOOT - COMPLETE 3+ VIEW

[foot ap]
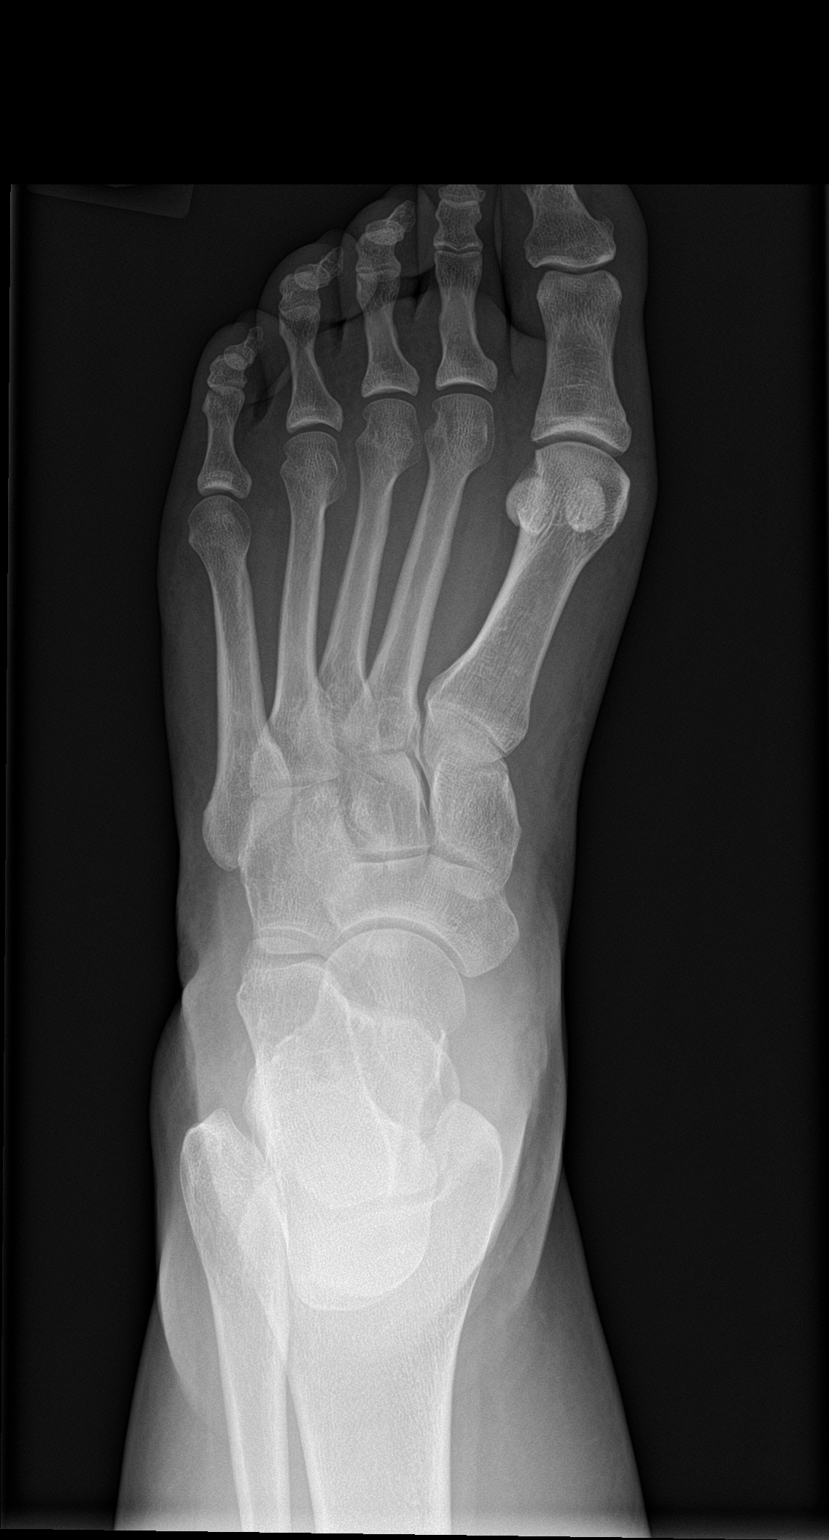

[foot obl]
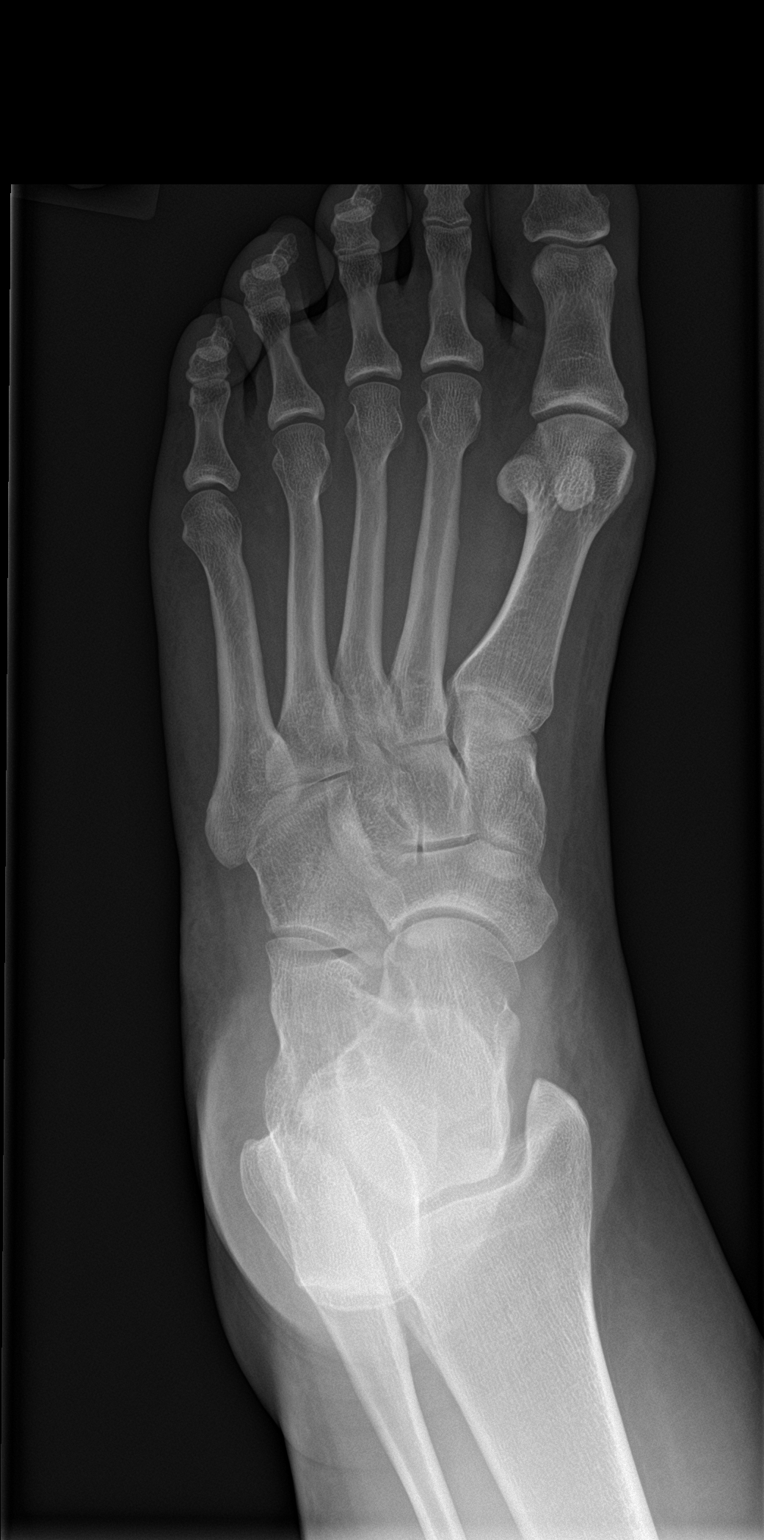

[foot lat]
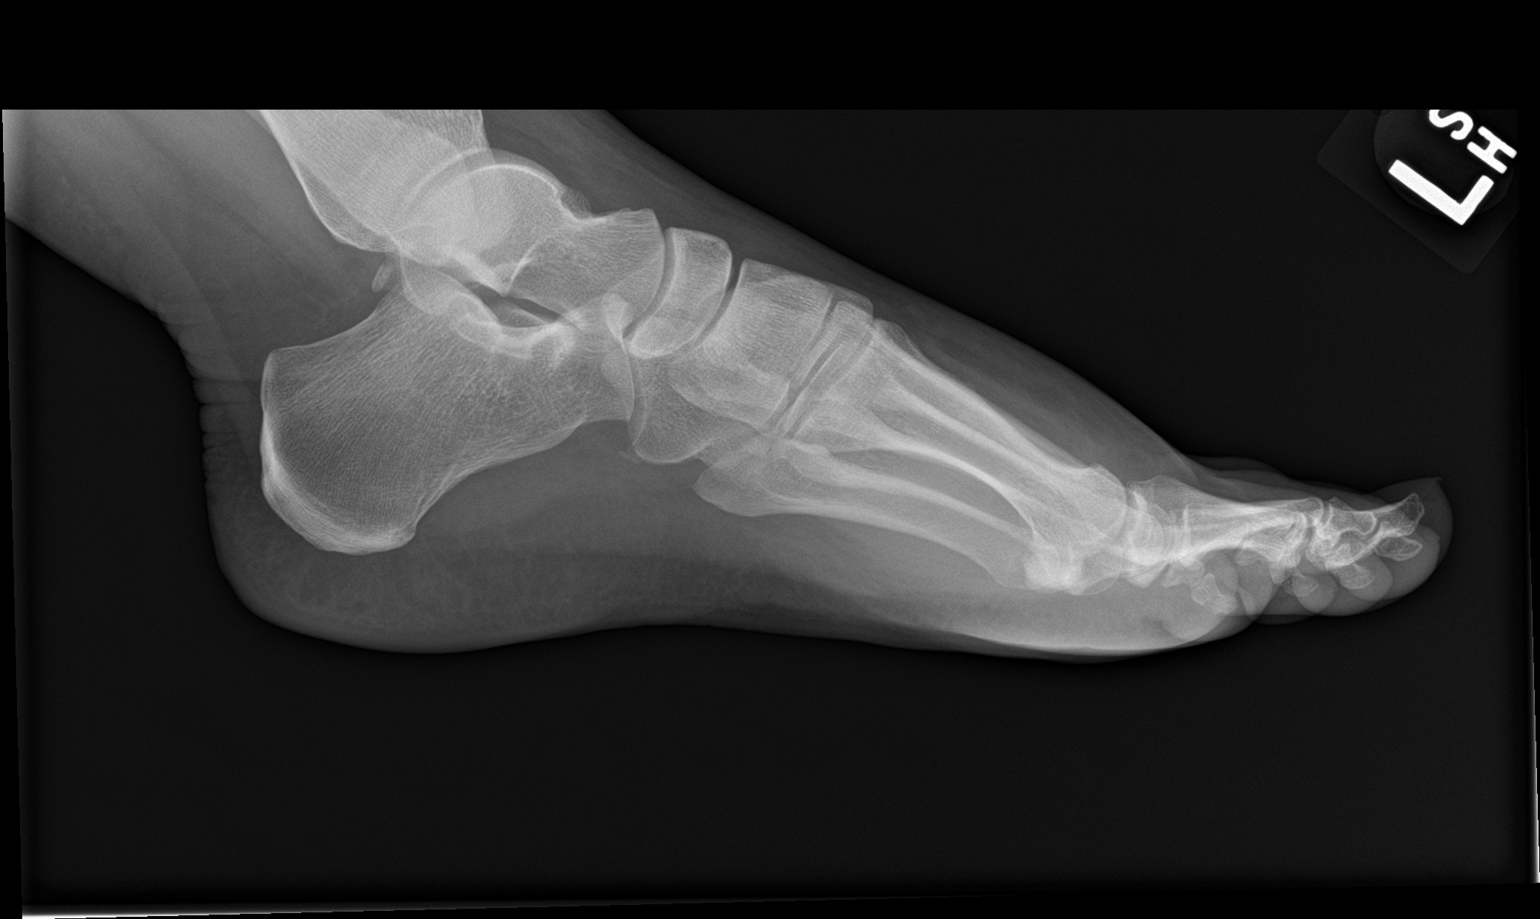

[3 of 3 positions shown; findings below may reference images not displayed]

FINDINGS: There is no evidence of fracture or dislocation. There is no
evidence of arthropathy or other focal bone abnormality. Soft
tissues are unremarkable.
IMPRESSION: Negative.

## 2024-02-23 ENCOUNTER — Encounter: Payer: Self-pay | Admitting: Family Medicine

## 2024-05-01 ENCOUNTER — Ambulatory Visit
Admission: EM | Admit: 2024-05-01 | Discharge: 2024-05-01 | Disposition: A | Discharge status: Home / Self Care | Payer: MEDICAID | Attending: Family Medicine | Admitting: Family Medicine

## 2024-05-01 ENCOUNTER — Other Ambulatory Visit: Payer: Self-pay

## 2024-05-01 ENCOUNTER — Telehealth: Payer: Self-pay

## 2024-05-01 DIAGNOSIS — K047 Periapical abscess without sinus: Secondary | ICD-10-CM

## 2024-05-01 DIAGNOSIS — K029 Dental caries, unspecified: Secondary | ICD-10-CM

## 2024-05-01 MED ORDER — KETOROLAC TROMETHAMINE 30 MG/ML IJ SOLN
30.0000 mg | Freq: Once | INTRAMUSCULAR | Status: AC
  Administered 2024-05-01: 30 mg via INTRAMUSCULAR

## 2024-05-01 MED ORDER — CLINDAMYCIN HCL 150 MG PO CAPS: 150.0000 mg | ORAL_CAPSULE | Freq: Three times a day (TID) | ORAL | 1 refills | Status: DC

## 2024-05-01 NOTE — Telephone Encounter (Signed)
 Spoke with patient and offered an appointment in office today but patient could not make the appointment due to work schedule. She is going to go to urgent care for treatment.  Also transferred patient to front desk to assist her in scheduling  a TOC appt with Benton Gave.

## 2024-05-01 NOTE — Discharge Instructions (Signed)
 May take over the counter Tylenol  Advil  or Aleve  for pain Take the antibiotic 3 times a day for a week I have given you a refill in case it is needed. See a dentist as soon as you can get an appointment

## 2024-05-01 NOTE — ED Provider Notes (Signed)
 [facility]    CSN: [id number] Arrival date & time: 05/01/24  1512      History   Chief Complaint Chief Complaint  Patient presents with   Dental Pain    HPI Carol Miles is a 33 y.o. female.   HPI  Patient has a known dental fracture left lower jaw.  She states that has been present for some time but now it swollen and red and very painful.  She feels like she needs antibiotics.  She cannot get into see the dentist until next week.  No fever or chills.  No nausea or vomiting.  Past Medical History:  Diagnosis Date   Bipolar 1 disorder (HCC)    Depression    HPV (human papilloma virus) anogenital infection    PTSD (post-traumatic stress disorder)     Patient Active Problem List   Diagnosis Date Noted   Annual physical exam 06/02/2022   Substance induced mood disorder (HCC) 11/21/2020   Depression    Acute psychosis (HCC) 07/20/2020   Bipolar I disorder, most recent episode mixed (HCC) 06/18/2020   Affective psychosis, bipolar (HCC) 06/10/2020   Amphetamine use disorder, severe (HCC)    Bipolar disorder (HCC) 05/09/2020   History of bipolar disorder 05/18/2019   Bipolar disorder, current episode mixed, moderate (HCC) 02/27/2019   Moderate benzodiazepine use disorder (HCC) 02/27/2019   Polysubstance abuse (HCC) 02/27/2019   Tobacco use disorder 02/27/2019   Methamphetamine use disorder, 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      Home Medications    Prior to Admission medications   Medication Sig Start Date End Date Taking? Authorizing Provider  clindamycin  (CLEOCIN ) 150 MG capsule Take 1 capsule (150 mg total) by mouth 3 (three) times daily. 05/01/24  Yes Maranda Jamee Jacob, MD  cariprazine (VRAYLAR) 3 MG capsule Take by  mouth.    [provider]    Family History Family History  Problem Relation Age of Onset   Hypertension Mother    Hypertension Father    Skin cancer Father    OCD Brother    Schizophrenia Maternal Grandmother    Ovarian cancer Maternal Aunt     Social History Social History   Tobacco Use   Smoking status: Every Day    Current packs/day: 1.00    Average packs/day: 1 pack/day for 15.0 years (15.0 ttl pk-yrs)    Types: Cigarettes   Smokeless tobacco: Never  Vaping Use   Vaping status: Every Day  Substance Use Topics   Alcohol  use: Yes   Drug use: Not Currently    Types: Methamphetamines, Marijuana    Comment: heroin     Allergies   Amoxicillin, Banana, Penicillins, and Sulfa antibiotics   Review of Systems Review of Systems See HPI  Physical Exam Triage Vital Signs ED Triage Vitals  Encounter Vitals Group     BP 05/01/24 1517 127/79     Girls Systolic BP Percentile --      Girls Diastolic BP Percentile --      Boys Systolic BP Percentile --      Boys Diastolic BP Percentile --      Pulse Rate 05/01/24 1517 92     Resp 05/01/24 1517 16     Temp  05/01/24 1517 99.3 F (37.4 C)     Temp src --      SpO2 05/01/24 1517 98 %     Weight --      Height --      Head Circumference --      Peak Flow --      Pain Score 05/01/24 1519 4     Pain Loc --      Pain Education --      Exclude from Growth Chart --    No data found.  Updated Vital Signs BP 127/79   Pulse 92   Temp 99.3 F (37.4 C)   Resp 16   LMP 04/01/2024 (Approximate)   SpO2 98%       Physical Exam Constitutional:      General: She is not in acute distress.    Appearance: She is well-developed.  HENT:     Head: Normocephalic and atraumatic.     Mouth/Throat:     Mouth: Mucous membranes are moist.     Pharynx: Posterior oropharyngeal erythema present.     Comments: The second to last molar on the left side posterior, there is some erythema of the surrounding gums and fracture  of the tooth Eyes:     Conjunctiva/sclera: Conjunctivae normal.     Pupils: Pupils are equal, round, and reactive to light.  Cardiovascular:     Rate and Rhythm: Normal rate.  Pulmonary:     Effort: Pulmonary effort is normal. No respiratory distress.  Abdominal:     General: There is no distension.     Palpations: Abdomen is soft.  Musculoskeletal:        General: Normal range of motion.     Cervical back: Normal range of motion.  Skin:    General: Skin is warm and dry.  Neurological:     Mental Status: She is alert.      UC Treatments / Results  Labs (all labs ordered are listed, but only abnormal results are displayed) Labs Reviewed - No data to display  EKG   Radiology No results found.  Procedures Procedures (including critical care time)  Medications Ordered in UC Medications  ketorolac  (TORADOL ) 30 MG/ML injection 30 mg (30 mg Intramuscular Given 05/01/24 1529)    Initial Impression / Assessment and Plan / UC Course  I have reviewed the triage vital signs and the nursing notes.  Pertinent labs & imaging results that were available during my care of the patient were reviewed by me and considered in my medical decision making (see chart for details).     Patient cites allergy to penicillins.  Will give her clindamycin  for the infection.  He was given a shot of Toradol  for the pain.  Return as needed Final Clinical Impressions(s) / UC Diagnoses   Final diagnoses:  Dental infection  Pain due to dental caries     Discharge Instructions      May take over the counter Tylenol  Advil  or Aleve  for pain Take the antibiotic 3 times a day for a week I have given you a refill in case it is needed. See a dentist as soon as you can get an appointment   ED Prescriptions     Medication Sig Dispense Auth. Provider   clindamycin  (CLEOCIN ) 150 MG capsule Take 1 capsule (150 mg total) by mouth 3 (three) times daily. 21 capsule Maranda Jamee Jacob, MD      I  have reviewed the PDMP during this encounter.  Maranda Jamee Jacob, MD 05/01/24 [phone]

## 2024-05-01 NOTE — Telephone Encounter (Signed)
 Copied from CRM [id number]. Topic: Clinical - Medical Advice >> Apr 30, 2024  4:45 PM Adrianna P wrote: Reason for CRM: Patient thinks she has a dental infection and can't cannot be seen by her dentist for a while and would like an antibiotic prescribed. Can be reached at  [phone]

## 2024-05-01 NOTE — ED Triage Notes (Signed)
 Left lower dental pain, pain x 1-2 weeks (has broken tooth). No otc meds.

## 2024-05-28 ENCOUNTER — Encounter: Payer: Self-pay | Admitting: Urgent Care

## 2024-05-28 ENCOUNTER — Ambulatory Visit (INDEPENDENT_AMBULATORY_CARE_PROVIDER_SITE_OTHER): Payer: MEDICAID | Admitting: Urgent Care

## 2024-05-28 VITALS — BP 111/76 | HR 95 | Resp 20 | Ht 67.0 in | Wt 204.0 lb

## 2024-05-28 DIAGNOSIS — F319 Bipolar disorder, unspecified: Secondary | ICD-10-CM | POA: Diagnosis not present

## 2024-05-28 MED ORDER — LURASIDONE HCL 20 MG PO TABS: 20.0000 mg | ORAL_TABLET | Freq: Every day | ORAL | 1 refills | Status: DC
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
## 2024-05-28 NOTE — Patient Instructions (Signed)
 Continue your Vraylar. Please add Latuda  20mg  daily.  Please return in one month for follow up and recheck.

## 2024-06-20 ENCOUNTER — Telehealth: Payer: Self-pay

## 2024-06-20 DIAGNOSIS — F319 Bipolar disorder, unspecified: Secondary | ICD-10-CM

## 2024-06-20 MED ORDER — CITALOPRAM HYDROBROMIDE 10 MG PO TABS: 10.0000 mg | ORAL_TABLET | Freq: Every day | ORAL | 3 refills | Status: DC

## 2024-06-20 NOTE — Telephone Encounter (Signed)
 Copied from CRM #[id number]. Topic: Clinical - Medication Question >> Jun 20, 2024 11:09 AM Alfonso ORN wrote: Reason for CRM: was mental health patient and was on an antidepression can not get an appointment at [facility] until october  want to know if Benton Gave will fill the  medication refill for antidepression for  Citalopram  10 mg  patient is out of the medication  Please contact patient to let know if this can be done   [id number] - [address], [place] - [address] [place] [address] Phone: [phone] Fax: [phone] Hours: Not open 24 hours

## 2024-06-20 NOTE — Telephone Encounter (Signed)
 Medication requested not showing in current medication list

## 2024-06-25 ENCOUNTER — Ambulatory Visit
Admission: EM | Admit: 2024-06-25 | Discharge: 2024-06-25 | Disposition: A | Discharge status: Home / Self Care | Payer: MEDICAID | Attending: Family Medicine | Admitting: Family Medicine

## 2024-06-25 ENCOUNTER — Encounter: Payer: Self-pay | Admitting: Emergency Medicine

## 2024-06-25 DIAGNOSIS — G43019 Migraine without aura, intractable, without status migrainosus: Secondary | ICD-10-CM | POA: Diagnosis not present

## 2024-06-25 MED ORDER — KETOROLAC TROMETHAMINE 30 MG/ML IJ SOLN
30.0000 mg | Freq: Once | INTRAMUSCULAR | Status: AC
  Administered 2024-06-25: 30 mg via INTRAMUSCULAR

## 2024-06-25 MED ORDER — ONDANSETRON 8 MG PO TBDP
8.0000 mg | ORAL_TABLET | Freq: Once | ORAL | Status: AC
  Administered 2024-06-25: 8 mg via ORAL

## 2024-06-25 NOTE — ED Triage Notes (Signed)
 Patient c/o waking up this afternoon w/a migraine HA.  Patient has vomited today.  Patient denies any OTC pain meds.

## 2024-06-25 NOTE — Discharge Instructions (Signed)
 The oral medicine will stop vomiting The injection will take away your headache  Home to rest

## 2024-06-25 NOTE — ED Provider Notes (Signed)
 Carol Miles    CSN: [id number] Arrival date & time: 06/25/24  1802      History   Chief Complaint Chief Complaint  Patient presents with   Migraine    HPI Carol Miles is a 33 y.o. female.   Patient states she has a long history of migraine headaches.  She states she gets them almost once a month.  She states usually she treats them at home.  Today, the headache is more severe.  She states that she took a nap and when she woke up she had a migraine.  Immediately vomited.  She has some light sensitivity.  No photophobia.  No recent infection, congestion, or fever.    Past Medical History:  Diagnosis Date   Bipolar 1 disorder (HCC)    Depression    HPV (human papilloma virus) anogenital infection    PTSD (post-traumatic stress disorder)     Patient Active Problem List   Diagnosis Date Noted   Bipolar I, most recent episode manic, severe with psychotic behavior (HCC) 06/27/2022   Annual physical exam 06/02/2022   Substance induced mood disorder (HCC) 11/21/2020   Depression    Acute psychosis (HCC) 07/20/2020   Bipolar I disorder, most recent episode mixed (HCC) 06/18/2020   Affective psychosis, bipolar (HCC) 06/10/2020   Amphetamine use disorder, severe (HCC)    Bipolar disorder (HCC) 05/09/2020   History of bipolar disorder 05/18/2019   Bipolar disorder, current episode mixed, moderate (HCC) 02/27/2019   Moderate benzodiazepine use disorder (HCC) 02/27/2019   Polysubstance abuse (HCC) 02/27/2019   Tobacco use disorder 02/27/2019   Methamphetamine use disorder, 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      Home Medications    Prior to Admission medications   Medication Sig Start Date End Date Taking? Authorizing Provider   Cariprazine HCl (VRAYLAR) 6 MG CAPS Take by mouth.   Yes [provider]  citalopram  (CELEXA ) 10 MG tablet Take 1 tablet (10 mg total) by mouth daily. 06/20/24  Yes Crain, Whitney L, PA  lurasidone  (LATUDA ) 20 MG TABS tablet Take 1 tablet (20 mg total) by mouth daily. 05/28/24  Yes Crain, Whitney L, PA    Family History Family History  Problem Relation Age of Onset   Hypertension Mother    Hypertension Father    Skin cancer Father    OCD Brother    Schizophrenia Maternal Grandmother    Ovarian cancer Maternal Aunt     Social History Social History   Tobacco Use   Smoking status: Every Day    Current packs/day: 1.00    Average packs/day: 1 pack/day for 15.0 years (15.0 ttl pk-yrs)    Types: Cigarettes   Smokeless tobacco: Never  Vaping Use   Vaping status: Former  Substance Use Topics   Alcohol  use: Yes   Drug use: Not Currently    Types: Methamphetamines, Marijuana    Comment: heroin     Allergies   Amoxicillin, Banana, Penicillins, and Sulfa antibiotics   Review of Systems Review of Systems  See HPI Physical Exam Triage Vital Signs ED Triage Vitals  Encounter Vitals Group     BP 06/25/24 1809 109/72     Girls Systolic BP Percentile --  Girls Diastolic BP Percentile --      Boys Systolic BP Percentile --      Boys Diastolic BP Percentile --      Pulse Rate 06/25/24 1809 78     Resp 06/25/24 1809 18     Temp 06/25/24 1809 98.2 F (36.8 C)     Temp Source 06/25/24 1809 Oral     SpO2 06/25/24 1809 100 %     Weight 06/25/24 1808 200 lb (90.7 kg)     Height 06/25/24 1808 5' 7 (1.702 m)     Head Circumference --      Peak Flow --      Pain Score 06/25/24 1808 6     Pain Loc --      Pain Education --      Exclude from Growth Chart --    No data found.  Updated Vital Signs BP 109/72 (BP Location: Right Arm)   Pulse 78   Temp 98.2 F (36.8 C) (Oral)   Resp 18   Ht 5' 7 (1.702 m)   Wt 90.7 kg   SpO2 100%   BMI 31.32 kg/m       Physical Exam Constitutional:      General: She is in acute distress.     Appearance: She is well-developed and normal weight.  HENT:     Head: Normocephalic and atraumatic.     Right Ear: Tympanic membrane normal.     Left Ear: Tympanic membrane normal.     Nose: Nose normal. No congestion.     Mouth/Throat:     Mouth: Mucous membranes are moist.     Pharynx: No posterior oropharyngeal erythema.  Eyes:     Conjunctiva/sclera: Conjunctivae normal.     Pupils: Pupils are equal, round, and reactive to light.     Comments: Discs are flat  Cardiovascular:     Rate and Rhythm: Normal rate.  Pulmonary:     Effort: Pulmonary effort is normal. No respiratory distress.  Musculoskeletal:        General: Normal range of motion.     Cervical back: Normal range of motion.  Skin:    General: Skin is warm and dry.  Neurological:     General: No focal deficit present.     Mental Status: She is alert.     Cranial Nerves: No cranial nerve deficit.     Sensory: No sensory deficit.      UC Treatments / Results  Labs (all labs ordered are listed, but only abnormal results are displayed) Labs Reviewed - No data to display  EKG   Radiology No results found.  Procedures Procedures (including critical Miles time)  Medications Ordered in UC Medications  ondansetron  (ZOFRAN -ODT) disintegrating tablet 8 mg (8 mg Oral Given 06/25/24 1827)  ketorolac  (TORADOL ) 30 MG/ML injection 30 mg (30 mg Intramuscular Given 06/25/24 1828)    Initial Impression / Assessment and Plan / UC Course  I have reviewed the triage vital signs and the nursing notes.  Pertinent labs & imaging results that were available during my Miles of the patient were reviewed by me and considered in my medical decision making (see chart for details).     Final Clinical Impressions(s) / UC Diagnoses   Final diagnoses:  Intractable migraine without aura and without status migrainosus     Discharge Instructions       The oral medicine will stop vomiting The injection will take away your headache  Home to rest  ED Prescriptions   None    PDMP not reviewed this encounter.   Maranda Jamee Jacob, MD 06/25/24 [phone]

## 2024-06-27 ENCOUNTER — Ambulatory Visit: Payer: MEDICAID | Admitting: Urgent Care

## 2024-07-06 ENCOUNTER — Ambulatory Visit
Admission: EM | Admit: 2024-07-06 | Discharge: 2024-07-06 | Disposition: A | Discharge status: Home / Self Care | Payer: MEDICAID | Attending: Family Medicine | Admitting: Family Medicine

## 2024-07-06 DIAGNOSIS — K047 Periapical abscess without sinus: Secondary | ICD-10-CM | POA: Diagnosis not present

## 2024-07-06 DIAGNOSIS — K029 Dental caries, unspecified: Secondary | ICD-10-CM

## 2024-07-06 MED ORDER — CLINDAMYCIN HCL 150 MG PO CAPS: 150.0000 mg | ORAL_CAPSULE | Freq: Three times a day (TID) | ORAL | 0 refills | Status: AC

## 2024-07-06 NOTE — ED Provider Notes (Signed)
 Carol Miles CARE    CSN: [id number] Arrival date & time: 07/06/24  0858      History   Chief Complaint Chief Complaint  Patient presents with   Dental Pain    HPI Carol Miles is a 33 y.o. female.   HPI 33 year old female presents with dental pain of bottom left for the past 5 days.  PMH significant for obesity, bipolar 1 disorder, HPV, and PTSD.  Past Medical History:  Diagnosis Date   Bipolar 1 disorder (HCC)    Depression    HPV (human papilloma virus) anogenital infection    PTSD (post-traumatic stress disorder)     Patient Active Problem List   Diagnosis Date Noted   Bipolar I, most recent episode manic, severe with psychotic behavior (HCC) 06/27/2022   Annual physical exam 06/02/2022   Substance induced mood disorder (HCC) 11/21/2020   Depression    Acute psychosis (HCC) 07/20/2020   Bipolar I disorder, most recent episode mixed (HCC) 06/18/2020   Affective psychosis, bipolar (HCC) 06/10/2020   Amphetamine use disorder, severe (HCC)    Bipolar disorder (HCC) 05/09/2020   History of bipolar disorder 05/18/2019   Bipolar disorder, current episode mixed, moderate (HCC) 02/27/2019   Moderate benzodiazepine use disorder (HCC) 02/27/2019   Polysubstance abuse (HCC) 02/27/2019   Tobacco use disorder 02/27/2019   Methamphetamine use disorder, 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      Home Medications    Prior to Admission medications   Medication Sig Start Date End Date Taking? Authorizing Provider  clindamycin  (CLEOCIN ) 150 MG capsule Take 1 capsule (150 mg total) by mouth 3 (three) times daily for 7 days. 07/06/24 07/13/24 Yes Teddy Sharper, FNP  Cariprazine HCl (VRAYLAR) 6 MG CAPS Take by mouth.    [provider]   citalopram  (CELEXA ) 10 MG tablet Take 1 tablet (10 mg total) by mouth daily. 06/20/24   Crain, Whitney L, PA  lurasidone  (LATUDA ) 20 MG TABS tablet Take 1 tablet (20 mg total) by mouth daily. 05/28/24   Lowella Benton CROME, PA    Family History Family History  Problem Relation Age of Onset   Hypertension Mother    Hypertension Father    Skin cancer Father    OCD Brother    Schizophrenia Maternal Grandmother    Ovarian cancer Maternal Aunt     Social History Social History   Tobacco Use   Smoking status: Every Day    Current packs/day: 1.00    Average packs/day: 1 pack/day for 15.0 years (15.0 ttl pk-yrs)    Types: Cigarettes   Smokeless tobacco: Never  Vaping Use   Vaping status: Former  Substance Use Topics   Alcohol  use: Yes   Drug use: Not Currently    Types: Methamphetamines, Marijuana    Comment: heroin     Allergies   Amoxicillin, Banana, Penicillins, and Sulfa antibiotics   Review of Systems Review of Systems  HENT:  Positive for dental problem.      Physical Exam Triage Vital Signs ED Triage Vitals  Encounter Vitals Group     BP      Girls Systolic BP Percentile      Girls Diastolic BP Percentile      Boys Systolic BP Percentile  Boys Diastolic BP Percentile      Pulse      Resp      Temp      Temp src      SpO2      Weight      Height      Head Circumference      Peak Flow      Pain Score      Pain Loc      Pain Education      Exclude from Growth Chart    No data found.  Updated Vital Signs BP 107/72 (BP Location: Right Arm)   Pulse 82   Temp 98 F (36.7 C) (Oral)   Resp 17   LMP  (LMP Unknown)   SpO2 99%     Physical Exam Vitals and nursing note reviewed.  Constitutional:      Appearance: Normal appearance. She is normal weight.  HENT:     Head: Normocephalic and atraumatic.     Mouth/Throat:     Mouth: Mucous membranes are moist.     Pharynx: Oropharynx is clear.     Comments: Lower mandible: Second molar (#18)  cracked in half, patient reports this happened roughly 6 months ago Eyes:     Extraocular Movements: Extraocular movements intact.     Conjunctiva/sclera: Conjunctivae normal.     Pupils: Pupils are equal, round, and reactive to light.  Cardiovascular:     Rate and Rhythm: Normal rate and regular rhythm.     Pulses: Normal pulses.     Heart sounds: Normal heart sounds.  Pulmonary:     Effort: Pulmonary effort is normal.     Breath sounds: Normal breath sounds. No wheezing, rhonchi or rales.  Musculoskeletal:        General: Normal range of motion.     Cervical back: Normal range of motion and neck supple.  Skin:    General: Skin is warm and dry.  Neurological:     General: No focal deficit present.     Mental Status: She is alert and oriented to person, place, and time. Mental status is at baseline.  Psychiatric:        Mood and Affect: Mood normal.        Behavior: Behavior normal.      UC Treatments / Results  Labs (all labs ordered are listed, but only abnormal results are displayed) Labs Reviewed - No data to display  EKG   Radiology No results found.  Procedures Procedures (including critical care time)  Medications Ordered in UC Medications - No data to display  Initial Impression / Assessment and Plan / UC Course  I have reviewed the triage vital signs and the nursing notes.  Pertinent labs & imaging results that were available during my care of the patient were reviewed by me and considered in my medical decision making (see chart for details).     MDM: 1.  Dental abscess-Rx'd clindamycin  150 mg capsule: Take 1 capsule 3 times daily for the next 7 days; 2.  Pain due to dental caries-advised patient may take OTC Ibuprofen  800 mg daily, as needed for dental pain. Advised patient to take medication as directed with food to completion.  Encouraged increase daily water  intake to 64 ounces per day while taking this medication.  Advised patient to follow-up with  her dentist as soon as possible for further evaluation.  Advised if symptoms worsen and/or unresolved please follow-up with your PCP or here for further  evaluation.  Patient discharged home, hemodynamically stable. Final Clinical Impressions(s) / UC Diagnoses   Final diagnoses:  Dental abscess  Pain due to dental caries     Discharge Instructions      Advised patient to take medication as directed with food to completion.  Encouraged increase daily water  intake to 64 ounces per day while taking this medication.  Advised patient to follow-up with her dentist as soon as possible for further evaluation.  Advised if symptoms worsen and/or unresolved please follow-up with your PCP or here for further evaluation.     ED Prescriptions     Medication Sig Dispense Auth. Provider   clindamycin  (CLEOCIN ) 150 MG capsule Take 1 capsule (150 mg total) by mouth 3 (three) times daily for 7 days. 21 capsule Prosper Paff, FNP      I have reviewed the PDMP during this encounter.   Teddy Sharper, FNP 07/06/24 [phone]

## 2024-07-06 NOTE — ED Triage Notes (Signed)
 Pt c/o dental pain x 4-5 days. Pain is on bottom left, is aware has cracked molar.

## 2024-07-06 NOTE — Discharge Instructions (Addendum)
 Advised patient to take medication as directed with food to completion.  Encouraged increase daily water  intake to 64 ounces per day while taking this medication.  Advised patient to follow-up with her dentist as soon as possible for further evaluation.  Advised if symptoms worsen and/or unresolved please follow-up with your PCP or here for further evaluation.

## 2024-07-09 ENCOUNTER — Ambulatory Visit: Payer: MEDICAID | Admitting: Medical-Surgical

## 2024-10-22 ENCOUNTER — Ambulatory Visit
Admission: EM | Admit: 2024-10-22 | Discharge: 2024-10-22 | Disposition: A | Discharge status: Home / Self Care | Payer: MEDICAID | Attending: Family Medicine | Admitting: Family Medicine

## 2024-10-22 ENCOUNTER — Encounter: Payer: Self-pay | Admitting: Emergency Medicine

## 2024-10-22 DIAGNOSIS — L739 Follicular disorder, unspecified: Secondary | ICD-10-CM

## 2024-10-22 MED ORDER — DOXYCYCLINE HYCLATE 100 MG PO CAPS: 100.0000 mg | ORAL_CAPSULE | Freq: Two times a day (BID) | ORAL | 0 refills | Status: DC

## 2024-10-22 NOTE — ED Provider Notes (Signed)
 " Carol Miles    CSN: [id number] Arrival date & time: 10/22/24  9175      History   Chief Complaint Chief Complaint  Patient presents with   Rash    HPI Carol Miles is a 34 y.o. female.   Patient has an infection in her right axilla.  She has about a dozen small pimples in the area and 1 that is a little bit larger.  Mild pain.  Otherwise well.  No fever    Past Medical History:  Diagnosis Date   Bipolar 1 disorder (HCC)    Depression    HPV (human papilloma virus) anogenital infection    PTSD (post-traumatic stress disorder)     Patient Active Problem List   Diagnosis Date Noted   Bipolar I, most recent episode manic, severe with psychotic behavior (HCC) 06/27/2022   Annual physical exam 06/02/2022   Substance induced mood disorder (HCC) 11/21/2020   Depression    Acute psychosis (HCC) 07/20/2020   Bipolar I disorder, most recent episode mixed (HCC) 06/18/2020   Affective psychosis, bipolar (HCC) 06/10/2020   Amphetamine use disorder, severe (HCC)    Bipolar disorder (HCC) 05/09/2020   History of bipolar disorder 05/18/2019   Bipolar disorder, current episode mixed, moderate (HCC) 02/27/2019   Moderate benzodiazepine use disorder (HCC) 02/27/2019   Polysubstance abuse (HCC) 02/27/2019   Tobacco use disorder 02/27/2019   Methamphetamine use disorder, 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      Home Medications    Prior to Admission medications  Medication Sig Start Date End Date Taking? Authorizing Provider  Cariprazine HCl (VRAYLAR) 6 MG CAPS Take by mouth.   Yes [provider]  doxycycline  (VIBRAMYCIN ) 100 MG capsule Take 1 capsule (100 mg total) by mouth 2 (two) times daily. 10/22/24  Yes Maranda Jamee Jacob, MD   citalopram  (CELEXA ) 10 MG tablet Take 1 tablet (10 mg total) by mouth daily. 06/20/24   Crain, Whitney L, PA  lurasidone  (LATUDA ) 20 MG TABS tablet Take 1 tablet (20 mg total) by mouth daily. 05/28/24   Lowella Benton CROME, PA    Family History Family History  Problem Relation Age of Onset   Hypertension Mother    Hypertension Father    Skin cancer Father    OCD Brother    Schizophrenia Maternal Grandmother    Ovarian cancer Maternal Aunt     Social History Social History[1]   Allergies   Amoxicillin, Banana, Penicillins, and Sulfa antibiotics   Review of Systems Review of Systems See HPI  Physical Exam Triage Vital Signs ED Triage Vitals  Encounter Vitals Group     BP 10/22/24 0849 106/69     Girls Systolic BP Percentile --      Girls Diastolic BP Percentile --      Boys Systolic BP Percentile --      Boys Diastolic BP Percentile --      Pulse Rate 10/22/24 0849 79     Resp 10/22/24 0849 18     Temp 10/22/24 0849 98.9 F (37.2 C)     Temp Source 10/22/24 0849 Oral     SpO2 10/22/24 0849 96 %     Weight 10/22/24 0848 205 lb (93 kg)  Height 10/22/24 0848 5' 7 (1.702 m)     Head Circumference --      Peak Flow --      Pain Score 10/22/24 0848 3     Pain Loc --      Pain Education --      Exclude from Growth Chart --    No data found.  Updated Vital Signs BP 106/69 (BP Location: Left Arm)   Pulse 79   Temp 98.9 F (37.2 C) (Oral)   Resp 18   Ht 5' 7 (1.702 m)   Wt 93 kg   LMP 10/08/2024   SpO2 96%   BMI 32.11 kg/m       Physical Exam Constitutional:      General: She is not in acute distress.    Appearance: She is well-developed. She is not ill-appearing.  HENT:     Head: Normocephalic and atraumatic.  Eyes:     Conjunctiva/sclera: Conjunctivae normal.     Pupils: Pupils are equal, round, and reactive to light.  Cardiovascular:     Rate and Rhythm: Normal rate.  Pulmonary:     Effort: Pulmonary effort is normal. No respiratory distress.   Musculoskeletal:        General: Normal range of motion.     Cervical back: Normal range of motion.  Skin:    General: Skin is warm and dry.     Findings: Rash present.     Comments: Multiple small pustules in right axilla, in the center there is a larger nodule that is about 1 cm.  Firm.  Nonfluctuant  Neurological:     Mental Status: She is alert.      UC Treatments / Results  Labs (all labs ordered are listed, but only abnormal results are displayed) Labs Reviewed - No data to display  EKG   Radiology No results found.  Procedures Procedures (including critical Miles time)  Medications Ordered in UC Medications - No data to display  Initial Impression / Assessment and Plan / UC Course  I have reviewed the triage vital signs and the nursing notes.  Pertinent labs & imaging results that were available during my Miles of the patient were reviewed by me and considered in my medical decision making (see chart for details).     Final Clinical Impressions(s) / UC Diagnoses   Final diagnoses:  Folliculitis     Discharge Instructions      Wash with antibacterial soap Take doxycycline  2 times a day See your doctor if you fail to improve   ED Prescriptions     Medication Sig Dispense Auth. Provider   doxycycline  (VIBRAMYCIN ) 100 MG capsule Take 1 capsule (100 mg total) by mouth 2 (two) times daily. 20 capsule Maranda Jamee Jacob, MD      PDMP not reviewed this encounter.    [1]  Social History Tobacco Use   Smoking status: Every Day    Current packs/day: 1.00    Average packs/day: 1 pack/day for 15.0 years (15.0 ttl pk-yrs)    Types: Cigarettes   Smokeless tobacco: Never  Vaping Use   Vaping status: Former  Substance Use Topics   Alcohol  use: Yes   Drug use: Not Currently    Types: Methamphetamines, Marijuana    Comment: heroin     Maranda Jamee Jacob, MD 10/22/24 [phone]  "

## 2024-10-22 NOTE — Discharge Instructions (Signed)
 Wash with antibacterial soap Take doxycycline  2 times a day See your doctor if you fail to improve

## 2024-10-22 NOTE — ED Triage Notes (Signed)
 Patient c/o boil or abscess under her right arm x 1 week  There are several bumps under her arm which she feels is spreading.  Noticed after shaving and switched to a new deodorant.  Patient has applied Betadine bid.

## 2024-11-07 ENCOUNTER — Ambulatory Visit: Payer: MEDICAID | Admitting: Family Medicine

## 2024-11-07 ENCOUNTER — Encounter: Payer: Self-pay | Admitting: Family Medicine

## 2024-11-07 VITALS — BP 111/46 | HR 86 | Ht 67.0 in | Wt 211.0 lb

## 2024-11-07 DIAGNOSIS — L723 Sebaceous cyst: Secondary | ICD-10-CM

## 2024-11-07 DIAGNOSIS — R21 Rash and other nonspecific skin eruption: Secondary | ICD-10-CM

## 2024-11-07 MED ORDER — MUPIROCIN 2 % EX OINT: TOPICAL_OINTMENT | Freq: Two times a day (BID) | CUTANEOUS | 0 refills | Status: AC

## 2024-11-07 NOTE — Patient Instructions (Signed)
 Please let us  know if you are not better in the next week.

## 2024-11-07 NOTE — Progress Notes (Signed)
 "  Acute Office Visit  Patient ID: Carol Miles, female    DOB: 10-18-1991, 34 y.o.   MRN: [id number]  PCP: Lowella Benton CROME, PA  Chief Complaint  Patient presents with   Rash    Subjective:     HPI  Discussed the use of AI scribe software for clinical note transcription with the patient, who gave verbal consent to proceed.  History of Present Illness Carol Miles is a 34 year old female who presents with a rash and recurrent skin infections.  Cutaneous rash x 4 to 5 days - Rash developed a few days after completing doxycycline  for suspected staph infection under right axilla. - Initial appearance on chest, subsequently spread to ears, back, and breast area. - Mildly pruritic at times, primarily a visual concern. - Attempted cleaning with alcohol  without improvement. - Recent use of new personal care products including soap, shampoo, and deodorant, suspected as possible contributors.  Recurrent skin infections - History of recurrent skin infections, including significant infection on back and under right axilla. - Right axillary infection treated with 10-day course of doxycycline ; improved but not fully resolved, leaving a large cyst-like area. - Multiple courses of antibiotics recently for skin infections. - No recent respiratory infections or fevers.  Psychotropic medication use - Taking Vraylar 6 mg daily for past two years.  No new medications recently  Substance use recovery - History of substance use, currently in recovery. - Attending support groups and therapy. - Clean for approximately 21 days, with past relapses involving legal substances.  Drug and food allergies - Allergic to penicillins, sulfa drugs, and bananas.   ROS     Objective:    BP (!) 111/46   Pulse 86   Ht 5' 7 (1.702 m)   Wt 211 lb (95.7 kg)   LMP 10/08/2024   SpO2 99%   BMI 33.05 kg/m    Physical Exam Vitals reviewed.  Constitutional:      Appearance: Normal appearance.   HENT:     Head: Normocephalic.  Pulmonary:     Effort: Pulmonary effort is normal.  Skin:    Comments: Rash erythemaous maculopapular fine rash on upper chest and upper back and neck.  A few fine pustules on her back.   Neurological:     Mental Status: She is alert and oriented to person, place, and time.  Psychiatric:        Mood and Affect: Mood normal.        Behavior: Behavior normal.       No results found for any visits on 11/07/24.     Assessment & Plan:   Problem List Items Addressed This Visit   None Visit Diagnoses       Rash    -  Primary   Relevant Medications   mupirocin  ointment (BACTROBAN ) 2 %     Sebaceous cyst of right axilla           Assessment and Plan Assessment & Plan Folliculitis and cutaneous abscess Rash likely mild folliculitis, possibly due to irritation. Persistent cutaneous abscess may need drainage. Vraylar unlikely cause. - Prescribed topical mupirocin  ointment, apply twice daily for one week. - Monitor rash for improvement; consider alternative causes if no improvement. - Contact clinic if rash worsens or spreads. - Consider dermatology referral for recurrent skin issues. - Evaluate need for drainage of cutaneous abscess if unresolved.   F/U with PCP for treatment of boil in right axilla, no active swelling,redness or inflammation  right now.    Meds ordered this encounter  Medications   mupirocin  ointment (BACTROBAN ) 2 %    Sig: Apply topically 2 (two) times daily. X 7 days    Dispense:  30 g    Refill:  0    No follow-ups on file.  Dorothyann Byars, MD [facility]   "
